# Patient Record
Sex: Male | Born: 1945 | Race: White | Hispanic: No | Marital: Married | Smoking: Former smoker
Health system: Southern US, Community
[De-identification: ages and names within clinical notes are randomized; demographics above are authoritative.]

## PROBLEM LIST (undated history)

## (undated) DIAGNOSIS — E559 Vitamin D deficiency, unspecified: Secondary | ICD-10-CM

## (undated) DIAGNOSIS — I509 Heart failure, unspecified: Secondary | ICD-10-CM

## (undated) DIAGNOSIS — I472 Ventricular tachycardia: Secondary | ICD-10-CM

## (undated) DIAGNOSIS — I219 Acute myocardial infarction, unspecified: Secondary | ICD-10-CM

## (undated) DIAGNOSIS — Z95810 Presence of automatic (implantable) cardiac defibrillator: Secondary | ICD-10-CM

## (undated) DIAGNOSIS — N486 Induration penis plastica: Secondary | ICD-10-CM

## (undated) DIAGNOSIS — I2510 Atherosclerotic heart disease of native coronary artery without angina pectoris: Secondary | ICD-10-CM

## (undated) DIAGNOSIS — I255 Ischemic cardiomyopathy: Secondary | ICD-10-CM

## (undated) DIAGNOSIS — N3010 Interstitial cystitis (chronic) without hematuria: Secondary | ICD-10-CM

## (undated) DIAGNOSIS — N400 Enlarged prostate without lower urinary tract symptoms: Secondary | ICD-10-CM

## (undated) DIAGNOSIS — F419 Anxiety disorder, unspecified: Secondary | ICD-10-CM

## (undated) DIAGNOSIS — Z1509 Genetic susceptibility to other malignant neoplasm: Secondary | ICD-10-CM

## (undated) DIAGNOSIS — F329 Major depressive disorder, single episode, unspecified: Secondary | ICD-10-CM

## (undated) DIAGNOSIS — I4729 Other ventricular tachycardia: Secondary | ICD-10-CM

## (undated) DIAGNOSIS — F32A Depression, unspecified: Secondary | ICD-10-CM

## (undated) DIAGNOSIS — Z8719 Personal history of other diseases of the digestive system: Secondary | ICD-10-CM

## (undated) DIAGNOSIS — L989 Disorder of the skin and subcutaneous tissue, unspecified: Secondary | ICD-10-CM

## (undated) DIAGNOSIS — Z85038 Personal history of other malignant neoplasm of large intestine: Secondary | ICD-10-CM

## (undated) HISTORY — PX: COLONOSCOPY: SHX174

## (undated) HISTORY — DX: Disorder of the skin and subcutaneous tissue, unspecified: L98.9

## (undated) HISTORY — DX: Atherosclerotic heart disease of native coronary artery without angina pectoris: I25.10

## (undated) HISTORY — DX: Ischemic cardiomyopathy: I25.5

## (undated) HISTORY — DX: Presence of automatic (implantable) cardiac defibrillator: Z95.810

## (undated) HISTORY — DX: Ventricular tachycardia: I47.2

## (undated) HISTORY — DX: Personal history of other malignant neoplasm of large intestine: Z85.038

## (undated) HISTORY — DX: Anxiety disorder, unspecified: F41.9

## (undated) HISTORY — DX: Other ventricular tachycardia: I47.29

## (undated) HISTORY — DX: Benign prostatic hyperplasia without lower urinary tract symptoms: N40.0

## (undated) HISTORY — DX: Vitamin D deficiency, unspecified: E55.9

## (undated) HISTORY — DX: Personal history of other diseases of the digestive system: Z87.19

## (undated) HISTORY — DX: Interstitial cystitis (chronic) without hematuria: N30.10

## (undated) HISTORY — DX: Depression, unspecified: F32.A

## (undated) HISTORY — DX: Induration penis plastica: N48.6

## (undated) HISTORY — PX: CARDIAC DEFIBRILLATOR PLACEMENT: SHX171

## (undated) HISTORY — DX: Major depressive disorder, single episode, unspecified: F32.9

## (undated) HISTORY — DX: Genetic susceptibility to other malignant neoplasm: Z15.09

---

## 1970-04-09 HISTORY — PX: TONSILLECTOMY: SHX5217

## 2002-12-17 ENCOUNTER — Encounter (HOSPITAL_COMMUNITY): Admission: RE | Admit: 2002-12-17 | Discharge: 2003-01-16 | Payer: Self-pay | Admitting: Pediatrics

## 2003-07-05 ENCOUNTER — Ambulatory Visit (HOSPITAL_COMMUNITY): Admission: RE | Admit: 2003-07-05 | Discharge: 2003-07-05 | Payer: Self-pay | Admitting: Family Medicine

## 2003-11-08 ENCOUNTER — Ambulatory Visit (HOSPITAL_COMMUNITY): Admission: RE | Admit: 2003-11-08 | Discharge: 2003-11-08 | Payer: Self-pay | Admitting: Family Medicine

## 2003-11-11 ENCOUNTER — Inpatient Hospital Stay (HOSPITAL_BASED_OUTPATIENT_CLINIC_OR_DEPARTMENT_OTHER): Admission: RE | Admit: 2003-11-11 | Discharge: 2003-11-11 | Payer: Self-pay | Admitting: *Deleted

## 2003-11-12 ENCOUNTER — Ambulatory Visit (HOSPITAL_COMMUNITY): Admission: RE | Admit: 2003-11-12 | Discharge: 2003-11-13 | Payer: Self-pay | Admitting: Cardiology

## 2004-01-13 ENCOUNTER — Ambulatory Visit (HOSPITAL_COMMUNITY): Admission: RE | Admit: 2004-01-13 | Discharge: 2004-01-14 | Payer: Self-pay | Admitting: *Deleted

## 2004-02-18 ENCOUNTER — Ambulatory Visit: Payer: Self-pay | Admitting: *Deleted

## 2004-04-09 DIAGNOSIS — I219 Acute myocardial infarction, unspecified: Secondary | ICD-10-CM

## 2004-04-09 HISTORY — PX: CORONARY STENT PLACEMENT: SHX1402

## 2004-04-09 HISTORY — DX: Acute myocardial infarction, unspecified: I21.9

## 2004-07-03 ENCOUNTER — Encounter (INDEPENDENT_AMBULATORY_CARE_PROVIDER_SITE_OTHER): Payer: Self-pay | Admitting: Specialist

## 2004-07-03 ENCOUNTER — Ambulatory Visit (HOSPITAL_COMMUNITY): Admission: RE | Admit: 2004-07-03 | Discharge: 2004-07-03 | Payer: Self-pay | Admitting: Gastroenterology

## 2004-07-07 ENCOUNTER — Encounter: Admission: RE | Admit: 2004-07-07 | Discharge: 2004-07-07 | Payer: Self-pay | Admitting: Surgery

## 2004-07-08 DIAGNOSIS — Z85038 Personal history of other malignant neoplasm of large intestine: Secondary | ICD-10-CM

## 2004-07-08 HISTORY — DX: Personal history of other malignant neoplasm of large intestine: Z85.038

## 2004-07-10 ENCOUNTER — Ambulatory Visit: Payer: Self-pay | Admitting: *Deleted

## 2004-07-18 ENCOUNTER — Inpatient Hospital Stay (HOSPITAL_BASED_OUTPATIENT_CLINIC_OR_DEPARTMENT_OTHER): Admission: RE | Admit: 2004-07-18 | Discharge: 2004-07-18 | Payer: Self-pay | Admitting: Cardiology

## 2004-07-18 ENCOUNTER — Ambulatory Visit: Payer: Self-pay | Admitting: Cardiology

## 2004-07-27 ENCOUNTER — Encounter (INDEPENDENT_AMBULATORY_CARE_PROVIDER_SITE_OTHER): Payer: Self-pay | Admitting: *Deleted

## 2004-07-27 ENCOUNTER — Inpatient Hospital Stay (HOSPITAL_COMMUNITY): Admission: RE | Admit: 2004-07-27 | Discharge: 2004-08-02 | Payer: Self-pay | Admitting: Surgery

## 2004-08-03 ENCOUNTER — Ambulatory Visit: Payer: Self-pay | Admitting: Oncology

## 2004-08-28 ENCOUNTER — Ambulatory Visit: Payer: Self-pay | Admitting: *Deleted

## 2004-11-30 ENCOUNTER — Ambulatory Visit: Payer: Self-pay | Admitting: Oncology

## 2005-03-19 ENCOUNTER — Ambulatory Visit (HOSPITAL_COMMUNITY): Admission: RE | Admit: 2005-03-19 | Discharge: 2005-03-19 | Payer: Self-pay | Admitting: Pediatrics

## 2005-04-09 HISTORY — PX: ABDOMINAL SURGERY: SHX537

## 2005-07-12 ENCOUNTER — Ambulatory Visit: Payer: Self-pay | Admitting: *Deleted

## 2005-07-16 ENCOUNTER — Ambulatory Visit: Payer: Self-pay | Admitting: Cardiology

## 2005-07-16 ENCOUNTER — Encounter (HOSPITAL_COMMUNITY): Admission: RE | Admit: 2005-07-16 | Discharge: 2005-08-15 | Payer: Self-pay | Admitting: *Deleted

## 2005-07-17 ENCOUNTER — Ambulatory Visit: Payer: Self-pay | Admitting: *Deleted

## 2005-07-18 ENCOUNTER — Inpatient Hospital Stay (HOSPITAL_BASED_OUTPATIENT_CLINIC_OR_DEPARTMENT_OTHER): Admission: RE | Admit: 2005-07-18 | Discharge: 2005-07-18 | Payer: Self-pay | Admitting: Cardiology

## 2005-07-18 ENCOUNTER — Ambulatory Visit: Payer: Self-pay | Admitting: Cardiology

## 2005-07-20 ENCOUNTER — Ambulatory Visit (HOSPITAL_COMMUNITY): Admission: RE | Admit: 2005-07-20 | Discharge: 2005-07-20 | Payer: Self-pay | Admitting: *Deleted

## 2005-07-20 ENCOUNTER — Ambulatory Visit: Payer: Self-pay | Admitting: Cardiology

## 2005-07-25 ENCOUNTER — Ambulatory Visit: Payer: Self-pay | Admitting: *Deleted

## 2005-08-02 ENCOUNTER — Ambulatory Visit: Payer: Self-pay | Admitting: Internal Medicine

## 2005-08-07 ENCOUNTER — Inpatient Hospital Stay (HOSPITAL_COMMUNITY): Admission: RE | Admit: 2005-08-07 | Discharge: 2005-08-08 | Payer: Self-pay | Admitting: Internal Medicine

## 2005-08-07 ENCOUNTER — Ambulatory Visit: Payer: Self-pay | Admitting: Internal Medicine

## 2005-08-07 HISTORY — PX: CARDIAC DEFIBRILLATOR PLACEMENT: SHX171

## 2005-08-22 ENCOUNTER — Ambulatory Visit: Payer: Self-pay

## 2005-09-07 ENCOUNTER — Ambulatory Visit (HOSPITAL_COMMUNITY): Admission: RE | Admit: 2005-09-07 | Discharge: 2005-09-07 | Payer: Self-pay | Admitting: Gastroenterology

## 2005-11-27 ENCOUNTER — Ambulatory Visit: Payer: Self-pay | Admitting: Internal Medicine

## 2005-11-30 ENCOUNTER — Ambulatory Visit: Payer: Self-pay | Admitting: Oncology

## 2005-11-30 LAB — COMPREHENSIVE METABOLIC PANEL
ALT: 23 U/L (ref 0–40)
AST: 19 U/L (ref 0–37)
Albumin: 4.4 g/dL (ref 3.5–5.2)
BUN: 20 mg/dL (ref 6–23)
Calcium: 9.5 mg/dL (ref 8.4–10.5)
Chloride: 104 mEq/L (ref 96–112)
Potassium: 4.6 mEq/L (ref 3.5–5.3)
Total Protein: 7.2 g/dL (ref 6.0–8.3)

## 2005-11-30 LAB — CBC WITH DIFFERENTIAL/PLATELET
Basophils Absolute: 0 10*3/uL (ref 0.0–0.1)
EOS%: 3.4 % (ref 0.0–7.0)
HGB: 13.1 g/dL (ref 13.0–17.1)
MCH: 27.4 pg — ABNORMAL LOW (ref 28.0–33.4)
NEUT#: 2.2 10*3/uL (ref 1.5–6.5)
RDW: 15.4 % — ABNORMAL HIGH (ref 11.2–14.6)
lymph#: 1.6 10*3/uL (ref 0.9–3.3)

## 2006-01-11 ENCOUNTER — Ambulatory Visit: Payer: Self-pay | Admitting: Cardiology

## 2006-02-18 ENCOUNTER — Ambulatory Visit: Payer: Self-pay | Admitting: Internal Medicine

## 2006-03-05 ENCOUNTER — Ambulatory Visit: Payer: Self-pay | Admitting: Oncology

## 2006-03-08 LAB — CBC WITH DIFFERENTIAL/PLATELET
Basophils Absolute: 0 10*3/uL (ref 0.0–0.1)
Eosinophils Absolute: 0.1 10*3/uL (ref 0.0–0.5)
HGB: 13.5 g/dL (ref 13.0–17.1)
MCV: 81.7 fL (ref 81.6–98.0)
MONO#: 0.5 10*3/uL (ref 0.1–0.9)
MONO%: 9.5 % (ref 0.0–13.0)
NEUT#: 2.6 10*3/uL (ref 1.5–6.5)
RBC: 4.88 10*6/uL (ref 4.20–5.71)
RDW: 14.6 % (ref 11.2–14.6)
WBC: 4.8 10*3/uL (ref 4.0–10.0)
lymph#: 1.6 10*3/uL (ref 0.9–3.3)

## 2006-03-08 LAB — CEA: CEA: <0.5 ng/mL (ref 0.0–5.0)

## 2006-03-28 ENCOUNTER — Ambulatory Visit: Payer: Self-pay | Admitting: Internal Medicine

## 2006-04-26 ENCOUNTER — Ambulatory Visit: Payer: Self-pay | Admitting: Internal Medicine

## 2006-05-03 ENCOUNTER — Encounter: Admission: RE | Admit: 2006-05-03 | Discharge: 2006-08-01 | Payer: Self-pay | Admitting: Internal Medicine

## 2006-05-30 ENCOUNTER — Ambulatory Visit: Payer: Self-pay | Admitting: Internal Medicine

## 2006-06-05 ENCOUNTER — Ambulatory Visit: Payer: Self-pay | Admitting: Oncology

## 2006-06-10 ENCOUNTER — Other Ambulatory Visit: Admission: RE | Admit: 2006-06-10 | Discharge: 2006-06-10 | Payer: Self-pay | Admitting: Oncology

## 2006-06-10 ENCOUNTER — Encounter (INDEPENDENT_AMBULATORY_CARE_PROVIDER_SITE_OTHER): Payer: Self-pay | Admitting: Specialist

## 2006-06-10 LAB — COMPREHENSIVE METABOLIC PANEL
Alkaline Phosphatase: 47 U/L (ref 39–117)
BUN: 25 mg/dL — ABNORMAL HIGH (ref 6–23)
Creatinine, Ser: 1.16 mg/dL (ref 0.40–1.50)
Glucose, Bld: 91 mg/dL (ref 70–99)
Total Bilirubin: 0.7 mg/dL (ref 0.3–1.2)

## 2006-06-10 LAB — CEA: CEA: <0.5 ng/mL (ref 0.0–5.0)

## 2006-06-17 LAB — CYTOLOGY, URINE

## 2006-07-04 ENCOUNTER — Ambulatory Visit: Payer: Self-pay | Admitting: Internal Medicine

## 2006-08-30 ENCOUNTER — Ambulatory Visit: Payer: Self-pay | Admitting: Internal Medicine

## 2006-09-09 ENCOUNTER — Ambulatory Visit (HOSPITAL_COMMUNITY): Admission: RE | Admit: 2006-09-09 | Discharge: 2006-09-09 | Payer: Self-pay | Admitting: Oncology

## 2006-09-17 ENCOUNTER — Ambulatory Visit (HOSPITAL_COMMUNITY): Admission: RE | Admit: 2006-09-17 | Discharge: 2006-09-17 | Payer: Self-pay | Admitting: Gastroenterology

## 2006-09-28 ENCOUNTER — Ambulatory Visit: Payer: Self-pay | Admitting: Internal Medicine

## 2006-10-03 ENCOUNTER — Ambulatory Visit: Payer: Self-pay | Admitting: Internal Medicine

## 2006-10-16 ENCOUNTER — Encounter (INDEPENDENT_AMBULATORY_CARE_PROVIDER_SITE_OTHER): Payer: Self-pay | Admitting: Urology

## 2006-10-16 ENCOUNTER — Ambulatory Visit (HOSPITAL_COMMUNITY): Admission: RE | Admit: 2006-10-16 | Discharge: 2006-10-16 | Payer: Self-pay | Admitting: Urology

## 2006-10-24 ENCOUNTER — Encounter: Payer: Self-pay | Admitting: Otolaryngology

## 2006-11-01 ENCOUNTER — Ambulatory Visit (HOSPITAL_COMMUNITY): Admission: RE | Admit: 2006-11-01 | Discharge: 2006-11-01 | Payer: Self-pay | Admitting: Otolaryngology

## 2006-11-01 ENCOUNTER — Encounter (INDEPENDENT_AMBULATORY_CARE_PROVIDER_SITE_OTHER): Payer: Self-pay | Admitting: Otolaryngology

## 2006-11-28 ENCOUNTER — Ambulatory Visit: Payer: Self-pay | Admitting: Internal Medicine

## 2006-12-05 ENCOUNTER — Ambulatory Visit: Payer: Self-pay | Admitting: Oncology

## 2006-12-10 LAB — CEA: CEA: <0.5 ng/mL (ref 0.0–5.0)

## 2006-12-13 ENCOUNTER — Ambulatory Visit: Payer: Self-pay | Admitting: Internal Medicine

## 2007-01-14 ENCOUNTER — Ambulatory Visit: Payer: Self-pay | Admitting: Internal Medicine

## 2007-02-24 ENCOUNTER — Ambulatory Visit (HOSPITAL_COMMUNITY): Admission: RE | Admit: 2007-02-24 | Discharge: 2007-02-24 | Payer: Self-pay | Admitting: Internal Medicine

## 2007-02-24 ENCOUNTER — Ambulatory Visit: Payer: Self-pay | Admitting: Internal Medicine

## 2007-03-25 ENCOUNTER — Observation Stay (HOSPITAL_COMMUNITY): Admission: AD | Admit: 2007-03-25 | Discharge: 2007-03-26 | Payer: Self-pay | Admitting: Cardiology

## 2007-03-25 ENCOUNTER — Emergency Department (HOSPITAL_COMMUNITY): Admission: EM | Admit: 2007-03-25 | Discharge: 2007-03-25 | Payer: Self-pay | Admitting: Emergency Medicine

## 2007-03-25 ENCOUNTER — Ambulatory Visit: Payer: Self-pay | Admitting: Cardiology

## 2007-04-17 ENCOUNTER — Ambulatory Visit: Payer: Self-pay | Admitting: Internal Medicine

## 2007-05-29 ENCOUNTER — Ambulatory Visit: Payer: Self-pay | Admitting: Internal Medicine

## 2007-06-05 ENCOUNTER — Ambulatory Visit: Payer: Self-pay | Admitting: Oncology

## 2007-07-17 ENCOUNTER — Ambulatory Visit: Payer: Self-pay | Admitting: Internal Medicine

## 2007-07-17 ENCOUNTER — Ambulatory Visit: Payer: Self-pay | Admitting: Oncology

## 2007-08-28 ENCOUNTER — Ambulatory Visit: Payer: Self-pay | Admitting: Internal Medicine

## 2007-11-27 ENCOUNTER — Ambulatory Visit: Payer: Self-pay | Admitting: Internal Medicine

## 2007-12-25 ENCOUNTER — Ambulatory Visit (HOSPITAL_COMMUNITY): Admission: RE | Admit: 2007-12-25 | Discharge: 2007-12-25 | Payer: Self-pay | Admitting: Pediatrics

## 2008-01-08 ENCOUNTER — Ambulatory Visit: Payer: Self-pay | Admitting: Internal Medicine

## 2008-02-26 ENCOUNTER — Ambulatory Visit: Payer: Self-pay | Admitting: Internal Medicine

## 2008-06-23 ENCOUNTER — Encounter: Payer: Self-pay | Admitting: Internal Medicine

## 2008-06-29 ENCOUNTER — Ambulatory Visit: Payer: Self-pay | Admitting: Internal Medicine

## 2008-06-29 ENCOUNTER — Encounter: Payer: Self-pay | Admitting: Internal Medicine

## 2008-07-02 ENCOUNTER — Encounter: Payer: Self-pay | Admitting: Internal Medicine

## 2008-07-02 ENCOUNTER — Ambulatory Visit: Payer: Self-pay | Admitting: Internal Medicine

## 2008-07-02 DIAGNOSIS — I509 Heart failure, unspecified: Secondary | ICD-10-CM

## 2008-07-02 DIAGNOSIS — I2510 Atherosclerotic heart disease of native coronary artery without angina pectoris: Secondary | ICD-10-CM

## 2008-07-20 ENCOUNTER — Telehealth: Payer: Self-pay | Admitting: Cardiology

## 2008-07-20 ENCOUNTER — Ambulatory Visit: Payer: Self-pay | Admitting: Oncology

## 2008-07-22 LAB — CEA: CEA: <0.5 ng/mL (ref 0.0–5.0)

## 2008-09-23 ENCOUNTER — Encounter (INDEPENDENT_AMBULATORY_CARE_PROVIDER_SITE_OTHER): Payer: Self-pay | Admitting: Gastroenterology

## 2008-09-23 ENCOUNTER — Ambulatory Visit (HOSPITAL_COMMUNITY): Admission: RE | Admit: 2008-09-23 | Discharge: 2008-09-23 | Payer: Self-pay | Admitting: Gastroenterology

## 2008-09-30 ENCOUNTER — Ambulatory Visit: Payer: Self-pay | Admitting: Internal Medicine

## 2008-12-21 ENCOUNTER — Encounter (INDEPENDENT_AMBULATORY_CARE_PROVIDER_SITE_OTHER): Payer: Self-pay | Admitting: *Deleted

## 2008-12-30 ENCOUNTER — Ambulatory Visit: Payer: Self-pay | Admitting: Internal Medicine

## 2009-02-07 ENCOUNTER — Encounter: Payer: Self-pay | Admitting: Internal Medicine

## 2009-03-11 ENCOUNTER — Ambulatory Visit: Payer: Self-pay | Admitting: Internal Medicine

## 2009-03-30 ENCOUNTER — Ambulatory Visit: Payer: Self-pay | Admitting: Internal Medicine

## 2009-03-31 ENCOUNTER — Encounter: Payer: Self-pay | Admitting: Internal Medicine

## 2009-04-12 ENCOUNTER — Encounter: Payer: Self-pay | Admitting: Internal Medicine

## 2009-05-31 ENCOUNTER — Encounter: Payer: Self-pay | Admitting: Internal Medicine

## 2009-06-01 LAB — CONVERTED CEMR LAB
CO2: 31 meq/L (ref 19–32)
Calcium: 9.4 mg/dL (ref 8.4–10.5)
Potassium: 4.6 meq/L (ref 3.5–5.3)
Sodium: 141 meq/L (ref 135–145)

## 2009-07-01 ENCOUNTER — Ambulatory Visit: Payer: Self-pay | Admitting: Internal Medicine

## 2009-07-20 ENCOUNTER — Ambulatory Visit: Payer: Self-pay | Admitting: Oncology

## 2009-07-22 LAB — CEA: CEA: 0.6 ng/mL (ref 0.0–5.0)

## 2009-08-08 ENCOUNTER — Ambulatory Visit: Payer: Self-pay | Admitting: Internal Medicine

## 2009-08-08 DIAGNOSIS — Z95810 Presence of automatic (implantable) cardiac defibrillator: Secondary | ICD-10-CM | POA: Insufficient documentation

## 2009-11-18 ENCOUNTER — Encounter (INDEPENDENT_AMBULATORY_CARE_PROVIDER_SITE_OTHER): Payer: Self-pay | Admitting: *Deleted

## 2009-11-21 ENCOUNTER — Ambulatory Visit: Payer: Self-pay | Admitting: Internal Medicine

## 2010-01-24 ENCOUNTER — Encounter: Payer: Self-pay | Admitting: Internal Medicine

## 2010-01-27 ENCOUNTER — Ambulatory Visit: Payer: Self-pay | Admitting: Internal Medicine

## 2010-01-27 ENCOUNTER — Encounter: Payer: Self-pay | Admitting: Internal Medicine

## 2010-03-02 ENCOUNTER — Ambulatory Visit: Payer: Self-pay | Admitting: Internal Medicine

## 2010-03-03 ENCOUNTER — Encounter: Payer: Self-pay | Admitting: Internal Medicine

## 2010-03-12 ENCOUNTER — Inpatient Hospital Stay (HOSPITAL_COMMUNITY)
Admission: EM | Admit: 2010-03-12 | Discharge: 2010-03-13 | Payer: Self-pay | Source: Home / Self Care | Attending: Internal Medicine | Admitting: Internal Medicine

## 2010-03-13 ENCOUNTER — Telehealth: Payer: Self-pay | Admitting: Internal Medicine

## 2010-03-17 ENCOUNTER — Encounter: Payer: Self-pay | Admitting: Internal Medicine

## 2010-03-17 ENCOUNTER — Ambulatory Visit: Payer: Self-pay | Admitting: Internal Medicine

## 2010-03-21 ENCOUNTER — Encounter (HOSPITAL_COMMUNITY)
Admission: RE | Admit: 2010-03-21 | Discharge: 2010-04-20 | Payer: Self-pay | Source: Home / Self Care | Attending: Pediatrics | Admitting: Pediatrics

## 2010-04-29 ENCOUNTER — Encounter: Payer: Self-pay | Admitting: Pediatrics

## 2010-04-30 ENCOUNTER — Encounter: Payer: Self-pay | Admitting: Pediatrics

## 2010-05-11 NOTE — Assessment & Plan Note (Signed)
Summary: eph 12/4 per phone call 12/5  jr [address] 845 am   Visit Type:  Post-hospital Primary Provider:  Acey Lav  CC:  No cardiac complaints.  History of Present Illness: IDENTIFICATION:  Jake Carter is a 65 year old gentleman with a history of CAD (status post MI with stent to the LAD in 2005), perfusion study in 2007 which showed an apical infarction, EF of 11.  He underwent repeat angiography that showed a 25% lesion before the stent and patent stent. 30% diagonal, otherwise, no significant disease.  RCA was non-dominant. EF was 25-30%  I last saw him in clinic in October.  He was just hospitalized a few days ago with chest pains.  He ruled out for MI and was sent home.  He described it as a epigastirc pain.  Lasted hours.  He walked on treadmill with pain without any change.  He went to ER because he did not know what was causing it. Since D/C he has not had any furhter spells.  He has been seen by S. Halm.  There appears to be some relation to food.intake.  He is being set up for a HIDA scan.  He has not had any furhter spells.  Doing his usual activities.  Allergies: 1)  ! Phenergan  Past History:  Past medical, surgical, family and social histories (including risk factors) reviewed, and no changes noted (except as noted below).  Past Medical History: Reviewed history from 03/11/2009 and no changes required. 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 Guidant ICD placement -- 5/07  Family History: Reviewed history from 06/15/2008 and no changes required. Mother died of cancer at 110.  Father died of cancer 51.   Sister died of cancer 15, and a brother died of cancer at 76.   Social History: Reviewed history from 06/15/2008 and no changes required. Full Time Married  Tobacco Use -  Former.  -- 1981 Alcohol Use - yes Regular Exercise - yes  Review of Systems       Stopped lisinopril because of dizziness.  Vital Signs:  Patient profile:   65 year old male Height:      67 inches Weight:      171.75 pounds BMI:     27.00 Pulse rate:   68 / minute Pulse rhythm:   regular Resp:     18 per minute BP sitting:   114 / 70  (left arm) Cuff size:   large  Vitals Entered By: Vikki Ports (March 17, 2010 8:36 AM)  Physical Exam  Additional Exam:  patient is in NAD HEENT:  Normocephalic, atraumatic. EOMI, PERRLA.  Neck: JVP is normal. No thyromegaly. No bruits.  Lungs: clear to auscultation. No rales no wheezes.  Heart: Regular rate and rhythm. Normal S1, S2. No S3.   No significant murmurs. PMI not displaced.  Abdomen:  Supple, nontender. Normal bowel sounds. No masses. No hepatomegaly.  Extremities:   Good distal pulses throughout. No lower extremity edema.  Musculoskeletal :moving all extremities.  Neuro:   alert and oriented x3.     ICD Specifications Following MD:  [name], MD     ICD Vendor:  [facility] [employer]     ICD Model Number:  T175     ICD Serial Number:  [id number] ICD DOI:  08/07/2005      Lead 1:    Location:  RV     DOI: 08/07/2005     Model #: [id number]     Serial #: [id number]     Status: active  Indications::  NSVT; ICM  Explantation Comments: Latitude  ICD Follow Up ICD Dependent:  No      Brady Parameters Mode VVI     Lower Rate Limit:  40      Tachy Zones VF:  240     VT:  200     VT1:  170     Impression & Recommendations:  Problem # 1:  CAD, NATIVE VESSEL (ICD-414.01) I am not convinced that current spells represent coronary ischemia.  I agree that a GI eval is warranted.   HIDA scan is scheduled.  If neg he will be set up to be seen by R. Buccini. I would keep on same meds.  Would not push BP further. The following medications were removed from the medication list:    Prinivil 5 Mg Tabs (Lisinopril) .Marland Kitchen... Take one tablet by mouth  daily His updated medication list for this problem includes:    Plavix 75 Mg Tabs (Clopidogrel bisulfate) .Marland Kitchen... Take one tablet by mouth daily    Metoprolol Tartrate 25 Mg Tabs (Metoprolol tartrate) .Marland Kitchen... Take 1/2 tablet once daily    Aspirin 81 Mg Tbec (Aspirin) .Marland Kitchen... 2 by mouth daily    Nitrostat 0.4 Mg Subl (Nitroglycerin) .Marland Kitchen... Take one tablet onde the tounge as needed for chest pain. if pain continues, you  may repeat every 5 minutes for a total of 3 pills  Orders: EKG w/ Interpretation (93000)  Problem # 2:  CONGESTIVE HEART FAILURE, UNSPECIFIED (ICD-428.0) Volume status looks good.   The following medications were removed from the medication list:    Prinivil 5 Mg Tabs (Lisinopril) .Marland Kitchen... Take one tablet by mouth daily His updated medication list for this problem includes:    Plavix 75 Mg Tabs (Clopidogrel bisulfate) .Marland Kitchen... Take one tablet by mouth daily    Metoprolol Tartrate 25 Mg Tabs (Metoprolol tartrate) .Marland Kitchen... Take 1/2 tablet once daily    Aspirin 81 Mg Tbec (Aspirin) .Marland Kitchen... 2 by mouth daily    Nitrostat 0.4 Mg Subl (Nitroglycerin) .Marland Kitchen... Take one tablet onde the tounge as needed for chest pain. if pain continues, you  may repeat every 5 minutes for a total of 3 pills  Problem # 3:  HYPERLIPIDEMIA-MIXED (ICD-272.4) Keep on same regimen. His updated medication list for this problem includes:    Lipitor 20 Mg Tabs (Atorvastatin calcium) .Marland Kitchen... Take one tablet by mouth daily.  Appended Document: eph 12/4 per phone call 12/5  jr apt. 845 am EKG:  Sinus rhythm.  Anterolateral MI.

## 2010-05-11 NOTE — Letter (Signed)
Summary: Remote Device Check  [employer], Main Office  [address]   [place], [place] [address]   Phone: [phone]  Fax: [phone]     April 12, 2009 MRN: [id number]   [facility] [address] [place], [place]  [address]   Dear Mr. Veale,   Your remote transmission was recieved and reviewed by your physician.  All diagnostics were within normal limits for you.   ___X___Your next office visit is scheduled for:     March 2011 with Dr Ladona Ridgel. Please call our office to schedule an appointment.    Sincerely,  Proofreader

## 2010-05-11 NOTE — Letter (Signed)
Summary: Device-Delinquent Phone Journalist, newspaper, Main Office  [address]   [place], [place] [address]   Phone: [phone]  Fax: [phone]     November 18, 2009 MRN: [id number]   [facility] [address] [place], [place]  [address]   Dear Mr. Helming,  According to our records, you were scheduled for a device phone transmission on   11/17/2009.                           .     We did not receive any results from this check.  If you transmitted on your scheduled day, please call us to help troubleshoot your system.  If you forgot to send your transmission, please send one upon receipt of this letter.  Thank you, Altha Harm, LPN  November 18, 2009 10:56 AM   [facility]

## 2010-05-11 NOTE — Assessment & Plan Note (Signed)
Summary: 3 month rov/sl   Visit Type:  Follow-up Primary Provider:  Acey Lav  CC:  no cardaic complaints. Pt is holding his lisinopril due to a cough..  History of Present Illness: Patient is a 65 year old with a history of CAD, cardiomyopathy, dyslipidemia.  I last saw him in the fall. He called in Feb.  Not feeling well.  Lab work was negative.   Since then he is feeling better. He denies chest pain.  No dizziness.  He is working out regularly. He has had a cough for months.  He thinks it may be due to a new dog they have .  He does not think it got worse with lisinopril but he has stopped it.  Still has a cough, mainly at night.  He says he feels drainage in his throat.  Current Medications (verified): 1)  Plavix 75 Mg Tabs (Clopidogrel Bisulfate) .... Take One Tablet By Mouth Daily 2)  Lipitor 20 Mg Tabs (Atorvastatin Calcium) .... Take One Tablet By Mouth Daily. 3)  Effexor Xr 37.5 Mg Xr24h-Cap (Venlafaxine Hcl) .Marland Kitchen.. 1 By Mouth Daily 4)  Metoprolol Tartrate 25 Mg Tabs (Metoprolol Tartrate) .... Take 1/2 Tablet Once Daily 5)  Aspirin 81 Mg Tbec (Aspirin) .... 2 By Mouth Daily 6)  Xanax 0.25 Mg Tabs (Alprazolam) .... As Needed  Allergies (verified): 1)  ! Phenergan  Past History:  Past medical, surgical, family and social histories (including risk factors) reviewed, and no changes noted (except as noted below).  Past Medical History: Reviewed history from 03/11/2009 and no changes required. 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 Guidant ICD placement -- 5/07  Family History: Reviewed history from 06/15/2008 and no changes required. Mother died of cancer at 19.  Father died of cancer 45.   Sister died of cancer 56, and a brother died of cancer at 25.   Social History: Reviewed  history from 06/15/2008 and no changes required. Full Time Married  Tobacco Use - Former.  -- 1981 Alcohol Use - yes Regular Exercise - yes  Vital Signs:  Patient profile:   64 year old male Height:      67 inches Weight:      163 pounds BMI:     25.62 Pulse rate:   60 / minute BP sitting:   110 / 68  (left arm) Cuff size:   regular  Vitals Entered By: Burnett Kanaris, CNA (July 01, 2009 10:36 AM)  Physical Exam  Additional Exam:  Patient is in NAD at rest HEENT:  Normocephalic, atraumatic. EOMI, PERRLA.  Neck: JVP is normal. No thyromegaly. No bruits.  Lungs: clear to auscultation. No rales no wheezes.  Heart: Regular rate and rhythm. Normal S1, S2. No S3.   No significant murmurs. PMI not displaced.  Abdomen:  Supple, nontender. Normal bowel sounds. No masses. No hepatomegaly.  Extremities:   Good distal pulses throughout. No lower extremity edema.  Musculoskeletal :moving all extremities.  Neuro:   alert and oriented x3.     ICD Specifications ICD Vendor:  [employer]     ICD Model Number:  T175     ICD Serial Number:  [id number] ICD DOI:  08/07/2005      Lead 1:    Location: RV     DOI: 08/07/2005     Model #: [id number]     Serial #:  [id number]     Status: active  Indications::  NSVT; ICM  Explantation Comments: Latitude  ICD Follow Up ICD Dependent:  No      Brady Parameters Mode VVI     Lower Rate Limit:  40      Tachy Zones VF:  240     VT:  200     VT1:  170     Impression & Recommendations:  Problem # 1:  CAD, NATIVE VESSEL (ICD-414.01) Asymptmatic.  Continue meds  Problem # 2:  CONGESTIVE HEART FAILURE, UNSPECIFIED (ICD-428.0) Volume status looks good.  Has f/u for ICD soon.  Keep on current regimen. I am not convinced it is the ACE inhibitor.  I sugg a trial of antihistamine.  I also sugg the dog not sleep in the bedroom He will try to resume lisinopril and see how his symptoms change.  Problem # 3:  HYPERLIPIDEMIA-MIXED (ICD-272.4) Keep on statin.   Excellent control. His updated medication list for this problem includes:    Lipitor 20 Mg Tabs (Atorvastatin calcium) .Marland Kitchen... Take one tablet by mouth daily. Prescriptions: PROTONIX 40 MG TBEC (PANTOPRAZOLE SODIUM) 1 pill every day  #30 x 6   Entered by:   Layne Benton, RN, BSN   Authorized by:   Sherrill Raring, MD, [facility]   Signed by:   Layne Benton, RN, BSN on 07/01/2009   Method used:   Electronically to        [facility]* (retail)       [address]       [place], [place]  [address]       Ph: [phone] or [phone]       Fax: [phone]   RxID:   [id number]

## 2010-05-11 NOTE — Assessment & Plan Note (Signed)
Summary: pc2 guidant/sl/appt time is 11:00/saf   Visit Type:  Follow-up Primary Provider:  Acey Lav   History of Present Illness: Mr. Advani returns today for followup.  He is a pleasant 65 yo man with an ICM, CHF (class 1-2) and dyslipidemia.  He denies c/p or sob.  His cough has improved after starting zyrtec.  He has had no intercurrent ICD therapies.  Current Medications (verified): 1)  Plavix 75 Mg Tabs (Clopidogrel Bisulfate) .... Take One Tablet By Mouth Daily 2)  Lipitor 20 Mg Tabs (Atorvastatin Calcium) .... Take One Tablet By Mouth Daily. 3)  Effexor Xr 37.5 Mg Xr24h-Cap (Venlafaxine Hcl) .... 3 Times Per Week. 4)  Metoprolol Tartrate 25 Mg Tabs (Metoprolol Tartrate) .... Take 1/2 Tablet Once Daily 5)  Aspirin 81 Mg Tbec (Aspirin) .... 2 By Mouth Daily 6)  Xanax 0.25 Mg Tabs (Alprazolam) .... As Needed 7)  Protonix 40 Mg Tbec (Pantoprazole Sodium) .... As Needed 8)  Lisinopril 2.5 Mg Tabs (Lisinopril) .... Take One-Half Tablet By Mouth Daily  Allergies (verified): 1)  ! 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    Vital Signs:  Patient profile:   65 year old male Height:      67 inches Weight:      162 pounds BMI:     25.46 Pulse rate:   62 / minute BP sitting:   92 / 60  (left arm)  Vitals Entered By: Laurance Flatten CMA (Aug 08, 2009 11:06 AM)  Physical Exam  General:  Well developed, well nourished, in no acute distress.  HEENT: normal Neck: supple. No JVD. Carotids 2+ bilaterally no bruits Cor: RRR no rubs, gallops or murmur. PMI is enlarged and laterally displaced. Lungs: CTA Ab: soft,  nontender. nondistended. No HSM. Good bowel sounds Ext: warm. no cyanosis, clubbing or edema Neuro: alert and oriented. Grossly nonfocal. affect pleasant     ICD Specifications Following MD:  [name], MD     ICD Vendor:  [facility] [employer]     ICD Model Number:  T175     ICD Serial Number:  [id number] ICD DOI:  08/07/2005      Lead 1:    Location: RV     DOI: 08/07/2005     Model #: [id number]     Serial #: [id number]     Status: active  Indications::  NSVT; ICM  Explantation Comments: Latitude  ICD Follow Up Remote Check?  No Battery Voltage:  2.66 V     Charge Time:  11.5 seconds     Underlying rhythm:  SR ICD Dependent:  No       ICD Device Measurements Right Ventricle:  Amplitude: 11.4 mV, Impedance: 655 ohms, Threshold: 0.8 V at 0.4 msec Shock Impedance: 49 ohms   Episodes MS Episodes:  0     Percent Mode Switch:  0     Shock:  0     ATP:  0     Nonsustained:  0     Atrial Therapies:  0 Ventricular Pacing:  0  Brady Parameters Mode VVI     Lower Rate Limit:  40      Tachy Zones VF:  240  VT:  200     VT1:  170     Tech Comments:  NORMAL DEVICE FUNCTION.  NO EPISODES AND NO CHANGES MADE.  Vella Kohler MD Comments:  Agree with above.  Impression & Recommendations:  Problem # 1:  AUTOMATIC IMPLANTABLE CARDIAC DEFIBRILLATOR SITU (ICD-V45.02) Normal device function.  Will recheck in several months.  Problem # 2:  CAD, NATIVE VESSEL (ICD-414.01) He denies anginal symptoms.  Continue current meds. His updated medication list for this problem includes:    Plavix 75 Mg Tabs (Clopidogrel bisulfate) .Marland Kitchen... Take one tablet by mouth daily    Metoprolol Tartrate 25 Mg Tabs (Metoprolol tartrate) .Marland Kitchen... Take 1/2 tablet once daily    Aspirin 81 Mg Tbec (Aspirin) .Marland Kitchen... 2 by mouth daily    Lisinopril 2.5 Mg Tabs (Lisinopril) .Marland Kitchen... Take one-half tablet by mouth daily  Problem # 3:  CONGESTIVE HEART FAILURE, UNSPECIFIED (ICD-428.0) He remains class 1-2.  Continue meds as below. His  updated medication list for this problem includes:    Plavix 75 Mg Tabs (Clopidogrel bisulfate) .Marland Kitchen... Take one tablet by mouth daily    Metoprolol Tartrate 25 Mg Tabs (Metoprolol tartrate) .Marland Kitchen... Take 1/2 tablet once daily    Aspirin 81 Mg Tbec (Aspirin) .Marland Kitchen... 2 by mouth daily    Lisinopril 2.5 Mg Tabs (Lisinopril) .Marland Kitchen... Take one-half tablet by mouth daily

## 2010-05-11 NOTE — Assessment & Plan Note (Signed)
Summary: per check out/sf   Visit Type:  Follow-up Primary Provider:  Acey Lav  CC:  no cardaic complaints.  History of Present Illness: patient is a 65 year old with a history of CAD, ICM.  I last saw him in March.  He has been seen in the interval by G. Ladona Ridgel. since seen he has done well  He remains active.  NO chest pains.  No SOB with activity.  He still has nasal D/C (clear) with postnasal drip    If takes Zyrtec it is better.  Dog is out of bedroom. Also had one spell of severe R foot pain  Felt possibly to be due to Gout.   Followed by S. Halm   Current Medications (verified): 1)  Plavix 75 Mg Tabs (Clopidogrel Bisulfate) .... Take One Tablet By Mouth Daily 2)  Lipitor 20 Mg Tabs (Atorvastatin Calcium) .... Take One Tablet By Mouth Daily. 3)  Effexor Xr 37.5 Mg Xr24h-Cap (Venlafaxine Hcl) .... 3 Times Per Week. 4)  Metoprolol Tartrate 25 Mg Tabs (Metoprolol Tartrate) .... Take 1/2 Tablet Once Daily 5)  Aspirin 81 Mg Tbec (Aspirin) .... 2 By Mouth Daily 6)  Xanax 0.25 Mg Tabs (Alprazolam) .... As Needed 7)  Famotidine 20 Mg Tabs (Famotidine) .... Daily 8)  Lisinopril 2.5 Mg Tabs (Lisinopril) .... Take One-Half Tablet By Mouth Daily  Allergies (verified): 1)  ! Phenergan  Past History:  Past medical, surgical, family and social histories (including risk factors) reviewed, and no changes noted (except as noted below).  Past Medical History: Reviewed history from 03/11/2009 and no changes required. 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 Guidant ICD placement -- 5/07  Family History: Reviewed history from 06/15/2008 and no changes required. Mother died of cancer at 51.  Father died of cancer 46.   Sister died of cancer 70, and a brother died of cancer at 24.   Social  History: Reviewed history from 06/15/2008 and no changes required. Full Time Married  Tobacco Use - Former.  -- 1981 Alcohol Use - yes Regular Exercise - yes  Review of Systems       All systems reviewed.  Neg to the above problem except as noted above.  Vital Signs:  Patient profile:   65 year old male Height:      67 inches Weight:      169 pounds BMI:     26.56 Pulse rate:   65 / minute BP sitting:   115 / 64  (left arm) Cuff size:   regular  Vitals Entered By: Burnett Kanaris, CNA (January 27, 2010 10:39 AM)  Physical Exam  Additional Exam:  patient is in NAD  Sniffling HEENT:  Normocephalic, atraumatic. EOMI, PERRLA.  Neck: JVP is normal. No thyromegaly. No bruits.  Lungs: clear to auscultation. No rales no wheezes.  Heart: Regular rate and rhythm. Normal S1, S2. No S3.   No significant murmurs. PMI not displaced.  Abdomen:  Supple, nontender. Normal bowel sounds. No masses. No hepatomegaly.  Extremities:   Good distal pulses throughout. No lower extremity edema.  Musculoskeletal :moving all extremities.  Neuro:   alert and oriented x3.     ICD Specifications Following MD:  [name], MD     ICD Vendor:  [facility] [employer]     ICD Model Number:  210 320 1870     ICD Serial Number:  [id number] ICD DOI:  08/07/2005      Lead 1:    Location: RV     DOI: 08/07/2005     Model #: [id number]     Serial #: [id number]     Status: active  Indications::  NSVT; ICM  Explantation Comments: Latitude  ICD Follow Up ICD Dependent:  No      Brady Parameters Mode VVI     Lower Rate Limit:  40      Tachy Zones VF:  240     VT:  200     VT1:  170     Impression & Recommendations:  Problem # 1:  CAD, NATIVE VESSEL (ICD-414.01) No signs of active ischemia.  Continue meds.  Problem # 2:  HYPERLIPIDEMIA-MIXED (ICD-272.4) Excellent on last check.  Continue.  Problem # 3:  CONGESTIVE HEART FAILURE, UNSPECIFIED (ICD-428.0) Volume status is good.  Functional Class 1 to 2.   With BP running a  little higher will increase Lisinopril to 5.  I do not htink this is contributing to his nasal symptoms.  Stopped in past  No change. Continue metoprolol at current dose He is very sensitive to meds.  He has had signif dizziness in the past.  i am not eager to push more.  would not add other agents given titration problems in past. Stay active.  F/U with Rosette Reveal.  Other Orders: EKG w/ Interpretation (93000)  Patient Instructions: 1)  Your physician recommends that you schedule a follow-up appointment in: 6 months. The office will mail you a reminder letter 2 months prior appointment date. 2)  Your physician has recommended you make the following change in your medication: Lisinopril dose increased to 5 mg once daily. New medication NTG 0.4 mg as needed for chest pain. Prescriptions: NITROSTAT 0.4 MG SUBL (NITROGLYCERIN) take one tablet onde the tounge as needed for chest pain. If pain continues, you  may repeat every 5 minutes for a total of 3 pills  #24 x 3   Entered by:   Ollen Gross, RN, BSN   Authorized by:   Sherrill Raring, MD, [facility]   Signed by:   Ollen Gross, RN, BSN on 01/27/2010   Method used:   Electronically to        [facility] (retail)       [address]       [place], [place]  [address]       Ph: [phone] or [phone]       Fax: [phone]   RxID:   [id number] PRINIVIL 5 MG TABS (LISINOPRIL) Take one tablet by mouth daily  #30 x 6   Entered by:   Ollen Gross, RN, BSN   Authorized by:   Sherrill Raring, MD, [facility]   Signed by:   Ollen Gross, RN, BSN on 01/27/2010   Method used:   Electronically to        [facility] (retail)       [address]       [place], [place]  [address]       Ph: [phone] or [phone]       Fax: [phone]   RxID:   [id number]

## 2010-05-11 NOTE — Cardiovascular Report (Signed)
Summary: Office Visit Remote   Office Visit Remote   Imported By: Roderic Ovens 12/20/2009 10:00:42  _____________________________________________________________________  External Attachment:    Type:   Image     Comment:   External Document

## 2010-05-11 NOTE — Progress Notes (Signed)
Summary: re post hospital visit  Phone Note Call from Patient   Caller: Patient [phone] Reason for Call: Talk to Nurse Summary of Call: pt just released from the hospital said he was to be seen asap with dr Tenny Craw, checking in echart there is no indication of this, her next appt is 1-9, can he wait or double book? Initial call taken by: Glynda Jaeger,  March 13, 2010 4:04 PM  Follow-up for Phone Call        Dr.Ross called patient and she advised him to come to the office on 12/9 at 845 am to see her for a post hospital visit.   Layne Benton, RN, BSN  March 13, 2010 5:27 PM

## 2010-05-11 NOTE — Miscellaneous (Signed)
Summary: Orders Update  Per phone call from Dr. Tenny Craw to Raechel Ache. Clinical Lists Changes  Orders: Added new Test order of T-Basic Metabolic Panel [phone]) - Signed Added new Test order of T-BNP  (B Natriuretic Peptide) ([id number]) - Signed Added new Referral order of Cardionet/Event Monitor (Cardionet/Event) - Signed

## 2010-05-11 NOTE — Cardiovascular Report (Signed)
Summary: Office Visit Remote   Office Visit Remote   Imported By: Roderic Ovens 03/17/2010 15:08:54  _____________________________________________________________________  External Attachment:    Type:   Image     Comment:   External Document

## 2010-05-12 NOTE — Cardiovascular Report (Signed)
Summary: Office Visit   Office Visit   Imported By: Roderic Ovens 08/12/2009 14:31:40  _____________________________________________________________________  External Attachment:    Type:   Image     Comment:   External Document

## 2010-06-01 ENCOUNTER — Encounter (INDEPENDENT_AMBULATORY_CARE_PROVIDER_SITE_OTHER): Payer: PRIVATE HEALTH INSURANCE

## 2010-06-01 ENCOUNTER — Encounter: Payer: Self-pay | Admitting: Internal Medicine

## 2010-06-01 DIAGNOSIS — I428 Other cardiomyopathies: Secondary | ICD-10-CM

## 2010-06-19 LAB — DIFFERENTIAL
Eosinophils Absolute: 0.3 10*3/uL (ref 0.0–0.7)
Eosinophils Relative: 5 % (ref 0–5)
Lymphs Abs: 1.9 10*3/uL (ref 0.7–4.0)

## 2010-06-19 LAB — POCT I-STAT, CHEM 8
BUN: 24 mg/dL — ABNORMAL HIGH (ref 6–23)
Calcium, Ion: 1.12 mmol/L (ref 1.12–1.32)
Chloride: 103 mEq/L (ref 96–112)
Creatinine, Ser: 1.2 mg/dL (ref 0.4–1.5)
Glucose, Bld: 92 mg/dL (ref 70–99)
HCT: 39 % (ref 39.0–52.0)

## 2010-06-19 LAB — CBC
Hemoglobin: 12.7 g/dL — ABNORMAL LOW (ref 13.0–17.0)
RBC: 4.58 MIL/uL (ref 4.22–5.81)
WBC: 6.3 10*3/uL (ref 4.0–10.5)

## 2010-06-19 LAB — COMPREHENSIVE METABOLIC PANEL
ALT: 17 U/L (ref 0–53)
AST: 19 U/L (ref 0–37)
CO2: 29 mEq/L (ref 19–32)
Calcium: 9 mg/dL (ref 8.4–10.5)
Chloride: 105 mEq/L (ref 96–112)
GFR calc Af Amer: >60 mL/min (ref >60)
GFR calc non Af Amer: >60 mL/min (ref >60)
Potassium: 3.9 mEq/L (ref 3.5–5.1)
Sodium: 139 mEq/L (ref 135–145)

## 2010-06-19 LAB — LIPID PANEL
Cholesterol: 122 mg/dL (ref 0–200)
HDL: 63 mg/dL (ref >39)
LDL Cholesterol: 54 mg/dL (ref 0–99)
Total CHOL/HDL Ratio: 1.9 RATIO
Triglycerides: 27 mg/dL (ref <150)
VLDL: 5 mg/dL (ref 0–40)

## 2010-06-19 LAB — POCT CARDIAC MARKERS: Troponin i, poc: <0.05 ng/mL (ref 0.00–0.09)

## 2010-06-19 LAB — LIPASE, BLOOD: Lipase: 31 U/L (ref 11–59)

## 2010-06-19 LAB — TROPONIN I: Troponin I: 0.03 ng/mL (ref 0.00–0.06)

## 2010-06-19 LAB — CK TOTAL AND CKMB (NOT AT ARMC)
CK, MB: 1.4 ng/mL (ref 0.3–4.0)
Relative Index: INVALID (ref 0.0–2.5)
Total CK: 62 U/L (ref 7–232)

## 2010-06-26 ENCOUNTER — Encounter: Payer: Self-pay | Admitting: *Deleted

## 2010-07-06 NOTE — Letter (Signed)
Summary: Remote Device Check  [employer], Main Office  [address]   [place], [place] [address]   Phone: [phone]  Fax: [phone]     June 26, 2010 MRN: [id number]   [facility] [address] [place], [place]  [address]   Dear Mr. Dasaro,   Your remote transmission was recieved and reviewed by your physician.  All diagnostics were within normal limits for you.  __X____Your next office visit is scheduled for:   June 2012 with Dr Ladona Ridgel. Please call our office to schedule an appointment.    Sincerely,  Vella Kohler

## 2010-07-06 NOTE — Cardiovascular Report (Signed)
Summary: Office Visit   Office Visit   Imported By: Roderic Ovens 06/27/2010 15:52:09  _____________________________________________________________________  External Attachment:    Type:   Image     Comment:   External Document

## 2010-07-17 LAB — CBC
HCT: 42.5 % (ref 39.0–52.0)
Hemoglobin: 14.4 g/dL (ref 13.0–17.0)
MCV: 84.2 fL (ref 78.0–100.0)
Platelets: 224 10*3/uL (ref 150–400)
RDW: 15.3 % (ref 11.5–15.5)

## 2010-07-17 LAB — BASIC METABOLIC PANEL
BUN: 12 mg/dL (ref 6–23)
Chloride: 103 mEq/L (ref 96–112)
Glucose, Bld: 94 mg/dL (ref 70–99)
Potassium: 4.1 mEq/L (ref 3.5–5.1)
Sodium: 139 mEq/L (ref 135–145)

## 2010-07-24 ENCOUNTER — Other Ambulatory Visit (HOSPITAL_COMMUNITY)
Admission: RE | Admit: 2010-07-24 | Discharge: 2010-07-24 | Disposition: A | Discharge status: Home / Self Care | Payer: PRIVATE HEALTH INSURANCE | Source: Ambulatory Visit | Attending: Oncology | Admitting: Oncology

## 2010-07-24 ENCOUNTER — Encounter (HOSPITAL_BASED_OUTPATIENT_CLINIC_OR_DEPARTMENT_OTHER): Payer: PRIVATE HEALTH INSURANCE | Admitting: Oncology

## 2010-07-24 ENCOUNTER — Other Ambulatory Visit: Payer: Self-pay | Admitting: Oncology

## 2010-07-24 DIAGNOSIS — I2510 Atherosclerotic heart disease of native coronary artery without angina pectoris: Secondary | ICD-10-CM

## 2010-07-24 DIAGNOSIS — Z950 Presence of cardiac pacemaker: Secondary | ICD-10-CM

## 2010-07-24 DIAGNOSIS — N3010 Interstitial cystitis (chronic) without hematuria: Secondary | ICD-10-CM

## 2010-07-24 DIAGNOSIS — C182 Malignant neoplasm of ascending colon: Secondary | ICD-10-CM

## 2010-07-24 LAB — URINALYSIS, MICROSCOPIC - CHCC
Ketones: NEGATIVE mg/dL
Nitrite: NEGATIVE
Protein: NEGATIVE mg/dL
Specific Gravity, Urine: 1.02 (ref 1.003–1.035)
WBC, UA: NEGATIVE (ref 0–2)
pH: 5 (ref 4.6–8.0)

## 2010-07-26 LAB — CYTOLOGY, URINE

## 2010-08-03 ENCOUNTER — Encounter: Payer: Self-pay | Admitting: Internal Medicine

## 2010-08-04 ENCOUNTER — Encounter: Payer: Self-pay | Admitting: Internal Medicine

## 2010-08-04 ENCOUNTER — Ambulatory Visit (INDEPENDENT_AMBULATORY_CARE_PROVIDER_SITE_OTHER): Payer: PRIVATE HEALTH INSURANCE | Admitting: Internal Medicine

## 2010-08-04 DIAGNOSIS — I509 Heart failure, unspecified: Secondary | ICD-10-CM

## 2010-08-04 DIAGNOSIS — E785 Hyperlipidemia, unspecified: Secondary | ICD-10-CM

## 2010-08-04 DIAGNOSIS — I2510 Atherosclerotic heart disease of native coronary artery without angina pectoris: Secondary | ICD-10-CM

## 2010-08-05 NOTE — Progress Notes (Signed)
HPI DENTIFICATION:  Jake Carter is a 65 year old gentleman with a history of CAD (status post MI with stent to the LAD in 2005), perfusion study in 2007 which showed an apical infarction, EF of 52.  He underwent repeat angiography that showed a 25% lesion before the stent and patent stent. 30% diagonal, otherwise, no significant disease.  RCA was non-dominant. EF was 25-30%  He was hospitalized last December with CP.  Rx medically. I saw him in clinic in December.  When I saw him in Dec he was being evaluated for possible gallbladder problems.  HIDA scan was negative He was seen by B. Buccini.  Rx'd for reflux.  Doing well since.  Has been off his antireflux meds. Since seen he has done well from a cardiac standpoint.  He denies chest pain.  He remains very active, exercising daily. He denies significant dizziness.  NO signif SOB. Allergies  Allergen Reactions  . Promethazine Hcl     Current Outpatient Prescriptions  Medication Sig Dispense Refill  . ALPRAZolam (XANAX) 0.25 MG tablet as needed.        Marland Kitchen aspirin 81 MG EC tablet 2 tabs by mouth daily       . atorvastatin (LIPITOR) 20 MG tablet Take 20 mg by mouth daily.        . clopidogrel (PLAVIX) 75 MG tablet Take 75 mg by mouth daily.        . metoprolol tartrate (LOPRESSOR) 25 MG tablet Take 1/2 tablet once daily.       . nitroGLYCERIN (NITROSTAT) 0.4 MG SL tablet Place 0.4 mg under the tongue every 5 (five) minutes as needed. For up to 3 doses.       Marland Kitchen venlafaxine (EFFEXOR-XR) 37.5 MG 24 hr capsule 3 times per week.         Past Medical History  Diagnosis Date  . Cardiomyopathy   . CAD (coronary artery disease)   . Dyslipidemia   . 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 ICD (implantable cardiac defibrillator) in place     ICD - Guidant  . Depression     Past Surgical History  Procedure Date  . Cardiac defibrillator placement 5/07    Guidant ICD placement  . Cardiac defibrillator placement    [employer] - Remote - yes    Family History  Problem Relation Age of Onset  . Cancer Father     died at 33  . Cancer Mother     died at 88  . Cancer Brother     died at 54  . Cancer Sister     died at 12    History   Social History  . Marital Status: Married    Spouse Name: N/A    Number of Children: N/A  . Years of Education: N/A   Occupational History  . Not on file.   Social History Main Topics  . Smoking status: Former Games developer  . Smokeless tobacco: Not on file   Comment: former 74  . Alcohol Use: Yes  . Drug Use: Not on file  . Sexually Active: Not on file   Other Topics Concern  . Not on file   Social History Narrative  . No narrative on file    Review of Systems:  All systems reviewed.  They are negative to the above problem except as previously stated.  Vital Signs: BP 96/60  Pulse 58  Ht 5\' 7"  (1.702 m)  Wt 165  lb (74.844 kg)  BMI 25.84 kg/m2  Physical Exam  HEENT:  Normocephalic, atraumatic. EOMI, PERRLA.  Neck: JVP is normal. No thyromegaly. No bruits.  Lungs: clear to auscultation. No rales no wheezes.  Heart: Regular rate and rhythm. Normal S1, S2. No S3.   No significant murmurs. PMI not displaced.  Abdomen:  Supple, nontender. Normal bowel sounds. No masses. No hepatomegaly.  Extremities:   Good distal pulses throughout. No lower extremity edema.  Musculoskeletal :moving all extremities.  Neuro:   alert and oriented x3.  CN II-XII grossly intact.  EKG:  Sinus bradycardia.  58 bpm.  Anterolateral MI.  Assessment and Plan:

## 2010-08-05 NOTE — Assessment & Plan Note (Signed)
Keep on same regimen. 

## 2010-08-05 NOTE — Assessment & Plan Note (Signed)
Doing well.  NO symptoms to suggest angina.  Volume status looks very good.  Keep on same regimen.

## 2010-08-05 NOTE — Assessment & Plan Note (Addendum)
Volume status looks good.  Will get echo on next visit.  Due for f/u with EP.

## 2010-08-22 NOTE — Op Note (Signed)
[name], Jake Carter            ACCOUNT NO.:  [id number]   MEDICAL RECORD NO.:  [id number]          PATIENT TYPE:  AMB   LOCATION:  ENDO                         FACILITY:  [facility]   PHYSICIAN:  Bernette Redbird, M.D.   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   PROCEDURE:  The procedure was familiar to the patient from prior  examinations and he provided written consent after coming as an  outpatient to the [facility].  Sedation was MAC by the  anesthesia department.  This procedure was performed immediately  following his upper endoscopy.   Digital exam of the prostate was normal.   The Pentax adult video colonoscope was advanced around the colon without  difficulty to the ileal anastomosis as identified by __________.   Some patchy erythema was noted in the left colon, with some slight  irregularity exudate and erythema __________ .  This began in the region  of left flexure and went down to the sigmoid region.  These changes were  very mild.  __________           ______________________________  Bernette Redbird, M.D.     RB/MEDQ  D:  09/23/2008  T:  09/23/2008  Job:  [id number]   cc:   Francoise Schaumann. [place], DO, FAAP  Fax: [phone]   Leighton Roach Truett Perna, M.D.  Fax: [phone]

## 2010-08-22 NOTE — Op Note (Signed)
Jake Carter, Jake Carter            ACCOUNT NO.:  [id number]   MEDICAL RECORD NO.:  [id number]          PATIENT TYPE:  AMB   LOCATION:  ENDO                         FACILITY:  [facility]   PHYSICIAN:  Bernette Redbird, M.D.   DATE OF BIRTH:  1945/09/16   DATE OF PROCEDURE:  09/23/2008  DATE OF DISCHARGE:                               OPERATIVE REPORT   PROCEDURE:  Upper endoscopy.   INDICATIONS:  Lynch syndrome in a 65 year old with history of colon  cancer.   FINDINGS:  Small hiatal hernia.  No evidence of duodenal adenomas.   PROCEDURE:  The procedure was familiar to the patient who provided  written consent after coming as an outpatient to the [facility]  Endoscopy Unit.  Sedation was MAC by the anesthesia team.  The Pentax  video endoscope was passed under direct vision, entering the esophagus  without significant difficulty.  The esophagus was endoscopically  normal, without evidence of Barrett's esophagus, reflux esophagitis,  varices, infection, neoplasia or any ring or stricture.  A 1-2 cm hiatal  hernia was present.   The stomach was entered.  It contained no significant residual.  There  was a little bit of patchy erythema in the prepyloric region as might be  expected from him being on aspirin and Plavix.  No erosive changes or  ulcers were seen and no polyps or masses were observed anywhere in the  stomach including a retroflexed view of the cardia.  The pylorus,  duodenal bulb, second duodenum, and what I believe was the proximal  third portion of the duodenum were all inspected twice and no lesions  were observed.  The scope was then removed from the patient.  He  tolerated the procedure well and there were no apparent complications.   IMPRESSION:  1. Lynch syndrome, with essentially negative upper endoscopy and in      particular, no duodenal abnormalities identified.  2. Mild antral gastritis, probably secondary to aspirin.   PLAN:  Proceed to colonoscopic  evaluation.           ______________________________  Bernette Redbird, M.D.     RB/MEDQ  D:  09/23/2008  T:  09/23/2008  Job:  [id number]   cc:   Francoise Schaumann. [place], DO, FAAP  Fax: [phone]   Leighton Roach Truett Perna, M.D.  Fax: [phone]

## 2010-08-22 NOTE — Assessment & Plan Note (Signed)
[facility]                            CARDIOLOGY OFFICE NOTE   NAME:Jake Carter, Jake Carter                 MRN:          [id number]  DATE:08/30/2006                            DOB:          1945-12-19    PATIENT IDENTIFICATION:  Jake Carter is a 65 year old gentleman with a  history of CAD (status post MI and stent in 2005), LVF of 25-30%. I last  saw him in clinic in February.   In the interval, he called last week and said he had been having more  dizziness and actually came off his medicines, metoprolol, Diovan and  Flomax.   The patient otherwise was feeling about the size just some dizziness,  still doing his 2 miles of walking. No chest pain, no PND, no syncope.   MEDICATIONS:  1. Plavix 75 daily.  2. Lipitor 20 daily.  3. Aspirin 325 daily.  4. Prevacid p.r.n.  5. Effexor 37.5 two q. Week.   REVIEW OF SYSTEMS:  Patient feeling better.   PHYSICAL EXAMINATION:  GENERAL:  The patient is in no distress.  VITAL SIGNS:  Blood pressure 108/70, pulse 68, weight 162. Orthostatics,  blood pressure laying 104/60, pulse 59, sitting 100/60, pulse 63,  standing at 0 minutes 98/60, pulse 67, at 2 minutes 100/62, pulse 65, at  5 minutes 100/60, pulse 61.  NECK:  JVP is normal.  LUNGS:  Clear. No rales or wheezes.  CARDIAC:  Regular rate and rhythm, S1, S2, no S3. No murmurs.  ABDOMEN:  Benign.  EXTREMITIES:  No edema.   IMPRESSION:  1. Hypotension. I am not sure what exacerbated a change. His blood      pressure is always on the lower side but to now not be on anything      because it was in the 80s before, I am not sure why. Not      orthostatic on exam. I will check a BMET, CBC and a TSH. I will      hold off on the cortisol pending the labs, especially since he is      improved.  2. Coronary artery disease, asymptomatic, still walking, would      continue.  3. Dyslipidemia. Continue on current regimen.   I told the patient today to add  back his metoprolol ER 12.5 daily and I  would like to see him in one month's time and reassess.     Pricilla Riffle, MD, [facility]  Electronically Signed    PVR/MedQ  DD: 08/30/2006  DT: 08/30/2006  Job #: [id number]   cc:   Francoise Schaumann. [place], DO, FAAP

## 2010-08-22 NOTE — H&P (Signed)
Jake Carter, Jake Carter        ACCOUNT NO.:  [id number]   MEDICAL RECORD NO.:  [id number]          PATIENT TYPE:  EMS   LOCATION:  MAJO                         FACILITY:  [facility]   PHYSICIAN:  Nicolasa Ducking, ANP DATE OF BIRTH:  1945-05-20   DATE OF ADMISSION:  03/25/2007  DATE OF DISCHARGE:                              HISTORY & PHYSICAL   PRIMARY CARDIOLOGIST:  Dr. Dietrich Pates.   PRIMARY EP:  [name].   PRIMARY CARE Keyera Hattabaugh:  Francoise Schaumann. [place], DO, FAAP.   PATIENT PROFILE:  A 65 year old Caucasian male with history of CAD and  ischemic cardiomyopathy, who presents with recurrent chest pain.   PROBLEM LIST:  1. Chest pain/CAD.      a.     November 12, 2003.  PCI of a 95% stenosis in the LAD, with       placement of a 2.5 x 24-mm Costar (study sent DES).      b.     July 18, 2004 and again July 18, 2005, cardiac       catheterization revealing patent LAD stent with otherwise       nonobstructive disease.  EF 35% to 40% with severe anterior antral       apical and infra-apical akinesis.  2. Ischemic cardiomyopathy/chronic systolic congestive heart failure.      a.     Status post Guidant Vitality single-lead AICD Aug 07, 2005 by       Dr. Ladona Ridgel.      b.     April 25, 2006, 2-D echocardiogram EF 30%, distal septal -       apical and distal lateral akinesis with global hypokinesis.  3. History of nonsustained VT.  4. Hyperlipidemia.  5. Colon cancer with diagnosis of Lynch syndrome.      a.     Status post right hemicolectomy 2006.      b.     Undergoing to 77-month survey of colonoscopies and yearly       abdominal CTs.  6. History hoarseness with left vocal fold lesion, status post      excision July 2008.   HISTORY OF PRESENT ILLNESS:  A 65 year old Caucasian male with history  of CAD and ischemic cardiomyopathy status post PCI the LAD with drug-  eluting stent August 2005 and single AICD placement May 2007.  He is  very active at home walking two miles a day on the  treadmill without  limitations.  This morning at approximately 9:30 a.m., he developed mild  4-5/10 retrosternal chest tightness and twinging, without  associated  symptoms, initially lasting approximately 20 to 30 minutes before  resolving spontaneously.  Symptoms have since been intermittent and 40,  lasting less than 1 minute, and he says symptoms are in no way similar  to his previous angina which was more  in line with the chest and throat, as well as bilateral arm crushing and  tightness.  He is presented to [facility] today where his  cardiac markers are negative x1.  He was treated with heparin and IV  nitroglycerin and eventually became pain free.  His IV nitro was  discontinued and he was transferred [facility] for further evaluation.  Currently, he is pain free.   ALLERGIES:  Sulfa causes GI upset.   HOME MEDICATIONS:  1. Plavix 75 daily.  2. Lipitor 20 daily.  3. Aspirin 325 daily.  4. Effexor 37.5 b.i.d.  5. Prevacid p.r.n.  6. Metoprolol 12.5 daily.  7. Vesicare 5 mg daily.   FAMILY HISTORY:  Mother died of cancer at 76.  Father died of cancer 91.  Sister died of cancer 35, and a brother died of cancer at 47.   SOCIAL HISTORY:  Lives in [place] with his wife and works as a  Therapist, nutritional.  He has about a 20 pack-year history of  tobacco abuse, quitting in October of 1981.  He has one to two glassed  of wine per week.  He exercises daily, walking about two miles a day at  the treadmill.   REVIEW OF SYSTEMS:  Positive for chest pain as outlined above.  Otherwise, all systems reviewed are negative.   PHYSICAL EXAM:  VITAL SIGNS:  Afebrile, his heart rate 55, respirations  15, blood pressure 124/72, pulse ox 100% on percent room air.  Pleasant  white male in no acute distress.  Awake, alert and oriented x3.  NECK:  No bruits or JVD.  LUNGS:  Respirations are regular, unlabored, clear to auscultation.  CARDIAC:  Regular S1-S2.  No S3 or 4  murmurs.  ABDOMEN:  Round, soft, nontender, nondistended.  Bowel sounds present  x4.  EXTREMITIES:  Warm, dry, pink.  No clubbing, cyanosis or edema.  Dorsalis pedis, posterior tibial pulses 2+ bilaterally.   Chest X-Ray shows no acute abnormalities.  EKG shows sinus bradycardia  at 55 beats per minute.  No left axis deviation, left anterior  fascicular block.  There is no acute ST-T changes compared to old ECG.   LAB WORK:  Hemoglobin 13.2, hematocrit 39.3, WBC 5.2, platelets 230,  sodium 138, potassium 3.8, chloride 101, CO2 30, BUN 18, creatinine  1.26, glucose 103, CK-MB less than 1.0, troponin-I less than 0.5,  calcium 9.5.   ASSESSMENT/PLAN:  1. Chest pain.  The patient presents with typical and atypical      features, not similar to previous angina.  Now pain free.  Plan to      admit and cycle cardiac markers.  Continue heparin, aspirin, beta      blocker, Statin.  If cardiac enzymes negative, plan to DC in the      a.m. and plan outpatient Myoview.  Cardiac enzymes positive or has      recurrent pain.  Plan cath.  2. Hyperlipidemia.  Check lipids and LFTs.  Continue Statin.  3. Ischemic cardiomyopathy/chronic systolic congestive heart failure.      Euvolemic on exam.  Continue beta blocker.  No ACE inhibitor or      ARB, secondary to history of hypotension when on a very low dose      Atacand in      the past.  4. History of colon cancer, followed at [facility] and at [facility] with q.      6 months surveillance colonoscopies and yearly CTs.      Nicolasa Ducking, ANP     CB/MEDQ  D:  03/25/2007  T:  03/26/2007  Job:  [id number]

## 2010-08-22 NOTE — Assessment & Plan Note (Signed)
[facility]                         ELECTROPHYSIOLOGY OFFICE NOTE   NAME:Jake Carter, Jake Carter                     MRN:          [id number]  DATE:06/29/2008                            DOB:          September 16, 1945    Mr. Dispenza returns today for followup.  He is a very pleasant middle-  aged male with an ischemic cardiomyopathy, nonsustained ventricular  tachycardia, class I heart failure who returns for followup.  He is  status post ICD insertion.  He has a Nurse, learning disability device placed in  May 2007.  He denies chest pain.  He denies shortness of breath.  He has  been traveling frequently and has been doing quite well otherwise.   MEDICINES:  1. Plavix 75 a day.  2. Lipitor 20 a day.  3. Aspirin 325 a day.  4. Metoprolol 12.5 daily.   PHYSICAL EXAMINATION:  GENERAL:  He is a pleasant, well-appearing,  middle-aged man in no acute distress.  VITAL SIGNS:  Blood pressure was 110/62, the pulse 64 and regular, the  respirations were 18.  Weight was 168 pounds.  NECK:  No jugular venous distention.  LUNGS:  Clear bilaterally to auscultation.  No wheezes, rales, or  rhonchi are present.  No increased work of breathing.  CARDIOVASCULAR:  Regular rate and rhythm.  Normal S1 and S2.  ABDOMEN:  Soft, nontender.  EXTREMITIES:  No edema.   Interrogation of his defibrillator demonstrates a Guidant Vitality.  The  R-waves were 12, the impedance 621, a threshold was 1 volt at 0.4.  Battery voltage was 3.04 volts.  Underlying rhythm was sinus at 60.  There were no intercurrent ICD therapies.   IMPRESSION:  1. Ischemic cardiomyopathy status post myocardial infarction.  2. Nonsustained ventricular tachycardia.  3. Status post implantable cardioverter-defibrillator insertion.   DISCUSSION:  Mr.  Gut is stable and his defibrillator is working  normally.  He has no heart failure to speak of, presently class I.  He  is having no anginal symptoms.  He will  continue his current medical  therapy.  I will see him back for ICD followup in 1 year.     Doylene Canning. Ladona Ridgel, MD  Electronically Signed    GWT/MedQ  DD: 06/29/2008  DT: 06/30/2008  Job #: [id number]   cc:   Francoise Schaumann. [place], DO, FAAP

## 2010-08-22 NOTE — Op Note (Signed)
[name], Jake Carter        ACCOUNT NO.:  [id number]   MEDICAL RECORD NO.:  [id number]          PATIENT TYPE:  AMB   LOCATION:  SDS                          FACILITY:  [facility]   PHYSICIAN:  Antony Contras, MD     DATE OF BIRTH:  05/11/1945   DATE OF PROCEDURE:  11/01/2006  DATE OF DISCHARGE:  11/01/2006                               OPERATIVE REPORT   PREOPERATIVE DIAGNOSES:  1. Hoarseness.  2. Left vocal fold lesion.   POSTOPERATIVE DIAGNOSIS:  1. Hoarseness.  2. Left vocal fold lesion.   PROCEDURE:  Suspended micro-direct laryngoscopy with left vocal fold  lesion excision.   SURGEON:  Antony Contras, M.D.   ANESTHESIA:  General endotracheal anesthesia.   COMPLICATIONS:  None.   INDICATIONS:  The patient is a 65 year old white male who had a left  vocal fold lesion noted recently on upper endoscopy.  He has been hoarse  for several months prior to this.  He presents to the operating room for  surgical excision.   FINDINGS:  The patient was found to have a pedunculated lesion on the  left vocal fold that was excised even with the mucosa of the vocal fold.  There was a little bit of white appearance to the surface but there was  no invasion grossly into the vocal fold.   DESCRIPTION OF PROCEDURE:  The patient was identified in the holding  room, informed consent having been obtained including a discussion of  risks, benefits, and alternatives. The patient was moved to the  operating suite and placed on the operating table in a supine position.  Anesthesia was induced and the patient was intubated by the anesthesia  team without difficulty using a #6 laser safe tube.  The patient was  given intravenous steroids during the case.  The eyes were taped closed  and the bed was turned 90 degrees from anesthesia.  A tooth guard was  placed and the larynx was exposed using Dedo laryngoscope.  A 0 degrees  telescope was used to document the lesion.  An epinephrine soaked  pledget was held against the lesion for a couple of minutes.  The lesion  was then grasped with side biting cup forceps and retracted medially  while left curved scissors was used to excise the lesion including the  mucosa at the stalk of the lesion.  The lesion was passed to nursing for  pathology.  An epinephrine pledget was held against the surgical site.  The site was examined and the excision did not extend down into the  vocal ligament.  After this, an LTA was inserted and lidocaine was  injected into the airway.  The laryngoscope was then taken out of  suspension and removed from the patient's mouth while the airway was  suctioned.  The tooth guard was removed.  The patient was returned to  anesthesia for wake-up.  He was extubated and moved to the recovery room  in stable condition.      Antony Contras, MD  Electronically Signed     DDB/MEDQ  D:  11/01/2006  T:  11/01/2006  Job:  [id number]

## 2010-08-22 NOTE — Assessment & Plan Note (Signed)
[facility]                       [place] CARDIOLOGY OFFICE NOTE   NAME:Mcadams, WENGER                 MRN:          [id number]  DATE:07/17/2007                            DOB:          [birth year]    IDENTIFICATION:  [name] is a 65 year old gentleman with CAD,  cardiomyopathy, nonsustained VT (status post ICD).  I last saw him in  January.   When I saw him last we pulled back on the Atacand because he had some  dizziness.  He actually is feeling better on this.  He continues on  metoprolol at only 12.5 daily.   He remains very active.  He says he tires out by the end of the day but  he really is very busy during the day and does not notice limitation.   CURRENT MEDICATIONS:  1. Plavix 75.  2. Lipitor 20.  3. Aspirin 325.  4. Effexor 37.5 two weekly.  5. Metoprolol 12.5 daily.   PHYSICAL EXAM:  The patient is in no distress.  Blood pressure is 92/70,  pulse is 66 and regular.  Weight not taken - the patient did not want.  LUNGS:  Clear.  NECK:  JVP is normal.  CARDIAC EXAM:  Regular rate and rhythm.  S1 and S2.  No S3, no murmurs.  ABDOMEN:  Benign.  No hepatomegaly.  No masses.  EXTREMITIES:  Good distal pulses.  No edema.   IMPRESSION:  1. Cardiomyopathy.  Volume status looks good.  His blood pressure is      tenuous.  He felt not good on the Atacand even at 1 mg.  I would      keep him on metoprolol, again a low dose for now.  If his blood      pressure climbs, will push further.  2. Coronary artery disease status post percutaneous transluminal      coronary angioplasty and stent to the left anterior descending      artery.  Last catheterization 2007 showed 25% stenosis before the      stent, otherwise no significant disease.  3. History of nonsustained ventricular tachycardia, has internal      cardioverter-defibrillator.  4. Dyslipidemia.  Good lipid control back in the winter, would      continue on current  regimen.   I will set to see the patient back in August, sooner if symptoms  develop.  Encouraged him to stay active.  Glad to hear he is backing  down on weekend work.     Pricilla Riffle, MD, [facility]  Electronically Signed    PVR/MedQ  DD: 07/17/2007  DT: 07/18/2007  Job #: [id number]   cc:   Francoise Schaumann. [place], DO, FAAP

## 2010-08-22 NOTE — Assessment & Plan Note (Signed)
[facility]                         ELECTROPHYSIOLOGY OFFICE NOTE   NAME:Villagomez, SODERQUIST                 MRN:          [id number]  DATE:01/14/2007                            DOB:          08/12/1945    Mr. Dumais returns today for followup.  He is a very pleasant middle-  aged man with ischemic cardiomyopathy and congestive heart failure and  nonsustained ventricular tachycardia, who is status post ICD insertion.  He returns today for followup.  The patient has recently undergone  multiple medical procedures but has been stable otherwise.  His main  complaint is that he does have problems with hypotension.   PHYSICAL EXAMINATION:  His blood pressure was 94/60 today with a pulse  of 68.  Respirations were 16.  The weight was 169.  NECK:  No jugular venous distention.  LUNGS:  Clear bilaterally to auscultation.  There are no wheezes, rales  or rhonchi.  CARDIOVASCULAR:  Regular rate and rhythm with a normal S1 and S2.  EXTREMITIES:  No edema.   MEDICATIONS:  1. Plavix 75 a day.  2. Lipitor 20 a day.  3. Aspirin 325 a day.  4. Effexor 37.5 two tablets daily.  5. Metoprolol 12.5 daily.  6. Atacand 1 mg daily.   Interrogation of his defibrillator demonstrates a Guidant Vitality T-  175.  The R waves were 10, the impedance 489 ohm. The threshold was 1  volt at 0.4.  The battery voltage was 3.2 volts.  There are no episodes  of VT.  He was not pacing in the ventricle.   IMPRESSION:  1. Ischemic cardiomyopathy.  2. Congestive heart failure.  3. Status post implantable cardioverter/defibrillator insertion.   DISCUSSION:  Overall, Mr. Urieta is stable. His defibrillator is  working normally.  We will see him back for ICD followup in one year,  and he will follow up in our [facility] program.     Doylene Canning. Ladona Ridgel, MD  Electronically Signed    GWT/MedQ  DD: 01/14/2007  DT: 01/15/2007  Job #: [id number]

## 2010-08-22 NOTE — Procedures (Signed)
NAMEMARCELINO, Jake Carter        ACCOUNT NO.:  [id number]   MEDICAL RECORD NO.:  [id number]          PATIENT TYPE:  OUT   LOCATION:  RAD                           FACILITY:  [facility]   PHYSICIAN:  Pricilla Riffle, MD, FACCDATE OF BIRTH:  September 20, 1945   DATE OF PROCEDURE:  DATE OF DISCHARGE:                                ECHOCARDIOGRAM   TESTS AND INDICATIONS:  The patient is a 65 year old with history of  CAD, LV dysfunction.   A 2D echo with echo Doppler.  Acoustic windows are somewhat difficult at  the apex, note contrast is used.   The left ventricle is normal in size, with end-diastolic dimension of 51  mm, the intraventricular septum is mildly thickened at 14 mm, posterior  wall at 12 mm.  Left atrium normal at 32 mm.  Right atrium and right  ventricle are normal in size.  Aortic root is normal at 34 mm.   The aortic valve is mildly sclerotic.  There is no insufficiency.  Mitral valve is mildly thickened with trace insufficiency.  Pulmonic  valve is normal with no insufficiency.  Tricuspid valve is normal with  trace insufficiency.   Overall LV systolic function appears to be depressed.  Again apical  windows are foreshortened in the two-chamber views.  LVF grossly is  depressed at approximately 30% with akinesis of the distal septal apical  distal lateral wall.  Hypokinesis elsewhere.  Note:  In the initial  images, there is echodensity at the apex, could not rule out artifact  versus clot; however, with IV echo contrast, the LV apex was clearly  seen, and there was no evidence of thrombus.   RV function is grossly normal.  Note:  RV catheter noted.   No pericardial effusion is seen.      Pricilla Riffle, MD, [facility]  Electronically Signed     PVR/MEDQ  D:  02/24/2007  T:  02/24/2007  Job:  [id number]   cc:   Francoise Schaumann. Milford Cage DO, FAAP  Fax: [phone]

## 2010-08-22 NOTE — Assessment & Plan Note (Signed)
[facility]                       [place] CARDIOLOGY OFFICE NOTE   NAME:[name], [name]                 MRN:          [id number]  DATE:02/24/2007                            DOB:          1946/02/07    IDENTIFICATION:  [name] is a 65 year old gentleman with ischemic  cardiomyopathy, coronary artery disease, nonsustained VT (status post  ICD).  I last saw him in October.   He comes in today.  He says he is a little more fatigued than usual.  He  usually plays 9 holes of golf; with a cart can do okay.  He has been  playing a little bit with his son; 18 holes.  He says at about 30 he  gets very tired, and after a game of 18, during the day he sleeps and  that night very well.  He is a little discouraged by this.  He says  usually he can do what he wants to do without a problem.  He is still  walking on the treadmill for 30 minutes average of about 3-1/2 miles per  hour, no problems.   He has noted a little bit nausea the past week, but his wife and son  also had the similar symptoms.  He denies any edema.  No PND.  No  orthopnea.  No chest pain.   CURRENT MEDICATIONS:  1. Plavix 75.  2. Lipitor 20.  3. Aspirin 325.  4. Effexor 37.5 two q. week  5. Prevacid p.r.n.  6. Metoprolol 12.5 q. day.  7. Atacand 1 mg.   PHYSICAL EXAMINATION:  Patient is in no distress.  Blood pressure 94/66, pulse 66 and regular, weight 163, down 6 pounds  from October.  LUNGS:  Clear.  CARDIAC:  Regular rate and rhythm; S1, S2.  No definite S3.  No  significant murmurs.  ABDOMEN:  No hepatomegaly.  NECK:  JVP is normal.  EXTREMITIES:  No edema.   IMPRESSION:  1. Cardiomyopathy.  Volume status actually looks good.  He has some      cramping in his legs at night, but there is no evidence for edema.      Potassium has been good.  I would continue to follow.  Again,      encouraged him to stay with his activity as he used to.  I have reviewed the echo  today, and I would like to get some additional  pictures.  His left ventricular function I am not convinced has changed.  Again, would like to review.  Shadow at the apex which may represent  artifact, but again, we will have the patient come back for additional  images.  1. Coronary artery disease, again I think is stable.  2. Status post implantable cardioverter-defibrillator with pacer      insertion.  3. Dyslipidemia, on Lipitor.  Last lipid panel in April was excellent;      LDL 60, HDL 51.   Tentatively set to see the patient back in 2 months' time, sooner if  problems develop.  He will call next week to see how he is doing.  Given  the flu and Pneumovax today.  Pricilla Riffle, MD, [facility]  Electronically Signed    PVR/MedQ  DD: 02/24/2007  DT: 02/25/2007  Job #: [id number]

## 2010-08-22 NOTE — Assessment & Plan Note (Signed)
 [facility]                       Jake Carter CARDIOLOGY OFFICE NOTE   NAME:Jake Carter, Jake Carter                 MRN:          [id number]  DATE:12/13/2006                            DOB:          1945/08/11    IDENTIFICATION:  Jake Carter is a 65 year old gentleman with a history  of CAD (status post MI with drug-eluting stent to the LAD in 2005.  Initially, LVEF was good but on recheck it was 25-30%).  I last saw him  back in June.   Since I saw him last, he has done well.  He denies dizziness.  He is  active.  No shortness of breath.  No chest pain.   He is off Flomax now.  He had his urologic procedure and also an ENT  procedure without problem.   CURRENT MEDICATIONS:  1. Plavix 75 daily.  2. Lipitor 20 daily.  3. Aspirin 325 daily.  4. Effexor 37.5 daily.  5. Prevacid weekly.  6. Metoprolol 12.5 daily.   PHYSICAL EXAMINATION:  Patient is in no distress.  Blood pressure is 109/69.  Pulse is 57 and regular.  Weight is 162.  LUNGS:  Clear.  NECK:  JVP is normal.  CARDIAC:  Regular rate and rhythm.  S1 and S2.  No S3.  ABDOMEN:  Benign.  EXTREMITIES:  No edema.   IMPRESSION:  1. Coronary artery disease/left ventricular dysfunction.  We will get      another echocardiogram to reconfirm his ejection fraction, given      that it has been up and down.  Clinically, he has been doing very      well.  I would try a low dose of an ARB.  Given him Atacand 4.  He      can break it into a quarter and then take a half.  Call if there      are problems.  Continue on the low-dose beta blockade.  Keep him on      Plavix and aspirin for now.  2. Dyslipidemia:  Last lipid panel was back in April.  Excellent      control.  LDL of 60.  HDL of 51.   I will set to see the back in the winter, sooner if problems develop.     Pricilla Riffle, MD, [facility]  Electronically Signed    PVR/MedQ  DD: 12/13/2006  DT: 12/13/2006  Job #: [id number]   cc:   Francoise Schaumann.  Halm, DO, FAAP

## 2010-08-22 NOTE — Op Note (Signed)
Jake Carter, [name]        ACCOUNT NO.:  [id number]   MEDICAL RECORD NO.:  [id number]          PATIENT TYPE:  AMB   LOCATION:  ENDO                         FACILITY:  [facility]   PHYSICIAN:  Bernette Redbird, M.D.   DATE OF BIRTH:  1945-04-18   DATE OF PROCEDURE:  09/17/2006  DATE OF DISCHARGE:                               OPERATIVE REPORT   PROCEDURE:  Upper endoscopy.   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   FINDINGS:  Mild gastritis consistent with aspirin therapy.   PROCEDURE:  The patient provided written consent for the procedure.  Sedation was fentanyl 100 mcg, Versed 10 mg and Phenergan 12.5 mg IV  prior to and during the course the procedure without arrhythmias or  desaturation.  The Pentax video endoscope was passed under direct  vision.   There was a small left vocal cord nodule suggestive of a small mucous  cyst, and some thickening of the posterior commissure of the larynx,  raising question of reflux laryngitis.   The esophagus itself was normal without evidence of reflux esophagitis,  Barrett's esophagus, varices, infection, neoplasia or any hiatal hernia  rings or stricture.   The stomach had prepyloric antral erythema, nonerosive, consistent with  aspirin exposure and there was also some patchy focal erythema along the  greater curve of the stomach.  No ulcers, erosions, polyps or masses  were observed in the stomach including a retroflexed view the cardia.   The pylorus, duodenal bulb and second duodenum looked normal.  The scope  was advanced as far as possible down the duodenum, entering what I  believe was the proximal to midportion of the third portion of the  duodenum.  No lesions were seen and although I did not visualize the  ampulla, inspection during pullout through that section of the duodenum  did not disclose any obvious intraluminal tumors.   The scope was removed from the patient.  He tolerated the procedure  well  and there were no apparent complications.   IMPRESSION:  1. 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  2. Vocal cord nodule and history of sore throat, raising question of      reflux laryngitis.  3. 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   PLAN:  1. ENT consultation.  2. The patient already uses Prevacid p.r.n.; I would recommend taking      it on a daily basis for possible reflux      laryngitis.  3. Follow-up endoscopy at whenever interval his oncologist feels is      appropriate, probably whenever he has a surveillance colonoscopy.           ______________________________  Bernette Redbird, M.D.     RB/MEDQ  D:  09/17/2006  T:  09/17/2006  Job:  [id number]   cc:   Francoise Schaumann. Raynelle Highland  Fax: [phone]   Dr. Efrain Sella, [phone], [facility] [place]   [place]. Dorethea Clan, MD  Fax: [phone]   Sandria Bales. Ezzard Standing, M.D.  [address]  [place]  [place] [address]

## 2010-08-22 NOTE — Assessment & Plan Note (Signed)
[facility]                       [place] CARDIOLOGY OFFICE NOTE   NAME:Jake Carter, Jake Carter                 MRN:          [id number]  DATE:10/03/2006                            DOB:          [birth year]    IDENTIFICATION:  Mr. Cumpton is a 65 year old gentleman with a history  of CAD (status post MI with stent, drug eluting, to the LAD in 2005, EF  initially was good, but is now done to 25-30% range with patent stent).  I last saw the patient back in May.   When I saw him last, he had some complaints of dizziness and we backed  down on his medicines. I placed him back on Metoprolol 12.5 mg daily.   In the interval, he has said that he has felt well. He is active as  always. He denies dizziness now. There is no shortness of breath, again,  not a problem. Blood pressure has averaged at 105/60s.   He does have some back pain that is pleuritic, left parasternal, left  lateral. He has had pleurisy in the past which is what this feels like,  but he denies any productive cough. There is no fever.   CURRENT MEDICATIONS:  1. Plavix 75 mg daily.  2. Lipitor 20 mg daily.  3. Aspirin 325 mg daily.  4. Effexor 3.75 mg 2 times per week.  5. Prevacid.  6. Metoprolol 12.5 mg.   REVIEW OF SYSTEMS:  The patient notes some hoarseness; seen by GI; put  on Prevacid, also to be seen by ENT.   The patient is also having workup by urology. He has question of an  abnormal cells. We will discuss with his oncologist, may indeed have to  a procedure done by Dr. Darvin Neighbours.   PHYSICAL EXAMINATION:  GENERAL:  The patient is in no distress.  VITAL SIGNS:  Blood pressure 98/59, pulse 56 and regular, weight 160.  NECK:  JVP is normal.  LUNGS:  Clear.  CHEST:  Mildly tender.  BACK:  Nontender.  CARDIAC:  Regular rate and rhythm, S1, S2, no definite S3.  EXTREMITIES:  No edema.   IMPRESSION:  1. Cardiomyopathy. Volume status looks good. If he does have a  procedure done, I recommend getting an echo to reconfirm his EF      that it has not changed. Again, his last one was in April 2007; EF      was 25-30%. Clinically, he has done so well though it would not      change how I am treating him.  2. Dyslipidemia. Continue on current regimen, excellent numbers.  3. Hypotension, improved.  4. Urologic. The patient may be considered for general anesthesia for      urologic procedure. From a cardiac standpoint, I think that he is      at a relatively low risk for an acute event. Note when he had his      procedure for his colon cancer done in 2006, he had just had the      stent placed within a year prior, question of whether he had a      transient event around  this time. Again, it is all hazy why his      stent is patent and he has a depressed LV function. Currently      though, his volume status is good. He is class 1 in his symptoms.      From a stent standpoint, I would try to keep him on aspirin. Plavix      can be held transiently if needed.  5. GI. Again, as noted above on Prevacid and to be seen by ENT.  6. Back pain. I am not sure what this represents. He had a CT scan      done. This is being evaluated, but reportedly showed nothing.   I will set follow up in three months sooner if problems develop. Again,  should urologic surgery be planned, we can check an echocardiogram to  reconfirm his current EF.   Note he has a guidant device that should be turned off if this done.     Pricilla Riffle, MD, [facility]  Electronically Signed    PVR/MedQ  DD: 10/03/2006  DT: 10/04/2006  Job #: [id number]   cc:   Francoise Schaumann. Halm, DO, FAAP  Bernette Redbird, M.D.  Ronald L. Earlene Plater, M.D.  Leighton Roach. Truett Perna, M.D.

## 2010-08-22 NOTE — Assessment & Plan Note (Signed)
[facility]                       [place] CARDIOLOGY OFFICE NOTE   NAME:Jake Carter, Jake Carter                 MRN:          [id number]  DATE:04/17/2007                            DOB:          Jun 22, 1945    IDENTIFICATION:  The patient is a 65 year old gentleman with a history  of an ischemic cardiomyopathy, CAD, nonsustained VT (status post ICD  placement).  I last saw him in November.   In the interval, he actually was hospitalized in December on the 16th  for evaluation of chest pain.  He ruled out for an MI and was sent home.  He thinks some of it may have been due to esophageal spasm.  He has  actually done well since.   He denies chest pain.  He remains active.  No significant shortness of  breath.   Current medications include:  1. Plavix 75.  2. Lipitor 20.  3. Aspirin 325.  4. Effexor 37.5 two times per week.  5. Prevacid p.r.n.  6. Metoprolol 12.5 daily.  7. Atacand 1 mg versus 2 mg daily.   PHYSICAL EXAMINATION:  On exam, the patient is in no acute distress.  His blood pressure a little better today, 110/70.  Pulse is 64 and  regular.  Weight not taken.  LUNGS:  Clear.  No rales.  CARDIAC EXAM:  Regular rate and rhythm.  S1, S2.  No S3.  NECK:  JVP is normal.  ABDOMEN:  No hepatomegaly.  EXTREMITIES:  No edema.   IMPRESSION:  1. Coronary artery disease.  I am not convinced the episodes of pain      were ischemic in etiology.  Question some subclinical reflux with      esophageal spasm.  I have told him to start his Prevacid again.  2. Cardiomyopathy.  Last echocardiogram, his LVEF was approximately      30%.  This was back in November.  Continue on current regimen.  I      would not press for now because his blood pressure has been on the      low side; keep at current dosing.  3,  Dyslipidemia.  Continue on Lipitor.   I will set to see the patient back in 3 months, sooner if problems  develop.   ADDENDUM:  Labs in the  hospital:  LDL 59, HDL 60.  AST, ALT normal.     Pricilla Riffle, MD, [facility]  Electronically Signed    PVR/MedQ  DD: 04/17/2007  DT: 04/18/2007  Job #: [id number]   cc:   Francoise Schaumann. Halm, DO, FAAP

## 2010-08-22 NOTE — Op Note (Signed)
Jake Carter, Jake Carter        ACCOUNT NO.:  [id number]   MEDICAL RECORD NO.:  [id number]          PATIENT TYPE:  AMB   LOCATION:  DAY                          FACILITY:  [facility]   PHYSICIAN:  Ronald L. Earlene Plater, M.D.  DATE OF BIRTH:  16-Oct-1945   DATE OF PROCEDURE:  10/16/2006  DATE OF DISCHARGE:                               OPERATIVE REPORT   PREOPERATIVE DIAGNOSES:  Urgency, frequency and a positive NMP-22.   POSTOPERATIVE DIAGNOSES:  Urgency, frequency and a positive NMP-22.   OPERATIVE PROCEDURE:  Cystourethroscopy, bilateral retrograde ureteral  pyelograms and barbotage bladder cytologies.   SURGEON:  Dr. Gaynelle Arabian.   ANESTHESIA:  LMA.   ESTIMATED BLOOD LOSS:  Negligible.   TUBES:  None.   COMPLICATIONS:  None.   INDICATIONS FOR PROCEDURE:  Mr. Cuevas  is a very nice 65 year old,  white male who presented with a long history of urgency, frequency and  some dysuria.  He underwent a workup including an office  cystourethroscopy that revealed no significant problems and his CT scan  was without significant abnormalities, there was a question of  interstitial cystitis but an NMP-22 was performed and was positive.  After understanding the risks, benefits and alternatives, it was elected  to proceed with the above procedure.   DESCRIPTION OF PROCEDURE:  The patient was placed in the supine position  and after proper LMA anesthesia was placed in the dorsal lithotomy  position, prepped and draped with Betadine in a sterile fashion.  Cystourethroscopy was performed with a 22.5 [place] Olympus panendoscope.  There was some tightness of the fossa navicularis which was opened with  the scope and dilated. The urethra was otherwise within normal limits.  There was moderate trilobar hypertrophy.  Grade 1 trabeculation was  noted of the bladder. The bladder was carefully inspected with 12 and 70  degree lenses and there were no lesions throughout. Barbotage bladder  cytologies  were obtained and were submitted for cytology and bilateral  retrograde ureteral pyelograms were performed with 6-French open-ended  catheters.  Both systems appeared to be totally normal and appeared to  drain well.  There were no filling defects or other abnormalities noted.  The bladder was drained.  The panendoscope was removed and the patient  was taken to the recovery room stable.   PROCEDURE:  Bilateral retrograde pyelograms.   DESCRIPTION OF PROCEDURE:  Utilizing a 6-French open-ended catheter,  bilateral retrograde ureteral pyelograms were performed and were noted  be within normal limits and drained well.      Ronald L. Earlene Plater, M.D.  Electronically Signed     RLD/MEDQ  D:  10/16/2006  T:  10/17/2006  Job:  [id number]

## 2010-08-22 NOTE — Discharge Summary (Signed)
NAMETREVION, HOBEN        ACCOUNT NO.:  [id number]   MEDICAL RECORD NO.:  [id number]          PATIENT TYPE:  INP   LOCATION:  4731                         FACILITY:  [facility]   PHYSICIAN:  Thomas C. Wall, MD, FACCDATE OF BIRTH:  02-Feb-1946   DATE OF ADMISSION:  03/25/2007  DATE OF DISCHARGE:  03/26/2007                               DISCHARGE SUMMARY   PRIMARY CARDIOLOGIST:  Dr. Dietrich Pates.   ELECTROPHYSIOLOGIST:  Dr. Gilman Schmidt.   PRIMARY CARE [place]:  Dr. Vivia Ewing.   DISCHARGE DIAGNOSIS:  Chest pain.   SECONDARY DIAGNOSES:  1. Coronary artery disease status post percutaneous coronary      intervention and stenting of the LAD in August 2005.  2. Ischemic cardiomyopathy, ejection fraction 30% by echocardiogram      November 2008.  3. Status post Guidant Vitality single lead AICD 5107.  4. History nonsustained ventricular tachycardia.  5. Hyperlipidemia.  6. Chronic systolic congestive heart failure.  7. 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  8. History of hoarseness status post left vocal fold lesion and      excision July 2008.   ALLERGIES:  SULFA CAUSES GI UPSET.   PROCEDURES:  None.   HISTORY OF PRESENT ILLNESS:  A 65 year old Caucasian male with above  problem list who was in his usual state of health until 9:30 a.m. on  December 16 when he had 4-5/10 retrosternal chest tightness and twinging  without associated symptoms, initially lasted 20-30 minutes before  resolving spontaneously.  Symptoms recurred intermittently throughout  the day and were fleeting in nature.  He presented to [facility] where ECG showed no acute changes.  First set of cardiac  markers were negative.  He was transferred to [facility] for further  evaluation.   HOSPITAL COURSE:  [name] has had no additional chest pain.  His  cardiac markers have remained negative, and we plan to discharge him  home today in satisfactory  condition and have arranged follow-up with  Dr. Tenny Craw.   DISCHARGE LABORATORY DATA:  Hemoglobin 12.6, hematocrit 37.5, WBC 5.6,  platelets 214, MCV 81.3.  Sodium 141, potassium 3.9, chloride 103, CO2  30, BUN 13, creatinine 0.13, glucose 92.  INR 1.2.  Total bilirubin 1.2,  alkaline phosphatase 41, AST 20, ALT 18, albumin 3.8.  Cardiac markers  negative x2.  Total cholesterol 124, triglycerides 25, HDL 60, LDL 59,  calcium 9.2.  TSH is pending.  BNP 130.   DISPOSITION:  The patient is being discharged home today in good  condition.   FOLLOW-UP PLANS AND APPOINTMENTS:  We have arranged for follow-up with  Dr. Dietrich Pates on April 17, 2007 at 3:30 p.m.  He is to follow up with  Dr. Milford Cage as previously scheduled.   DISCHARGE MEDICATIONS:  1. Plavix 75 mg daily.  2. Aspirin 325 mg daily.  3. Toprol XL 12.5 mg daily.  4. Lipitor 20 mg every night.  5. Vesicare 5 mg daily.  6. Prevacid p.r.n.  7. Effexor 37.5 mg as previously prescribed.  8. Nitroglycerin 0.4 mg sublingual p.r.n. chest pain.  OUTSTANDING LABORATORY STUDIES:  None.  TSH is pending.   The duration of discharge encounter 48 minutes including physician time.      Nicolasa Ducking, ANP      Jesse Sans. Daleen Squibb, MD, [facility]  Electronically Signed    CB/MEDQ  D:  03/26/2007  T:  03/26/2007  Job:  [id number]   cc:   Francoise Schaumann. Halm, DO, FAAP

## 2010-08-22 NOTE — Assessment & Plan Note (Signed)
[facility]                            CARDIOLOGY OFFICE NOTE   NAME:Jake Carter, Jake Carter                     MRN:          [id number]  DATE:01/08/2008                            DOB:          [birth year]    IDENTIFICATION:  [name] is a 65 year old gentleman with CAD,  cardiomyopathy, nonsustained VT (status post ICD) and dyslipidemia.  I  last saw him in April.   In the interval, he is doing very well.  He has cut back on some of his  work, but is still working and the stress load is less.  He denies  dizziness.  Says his pressures actually running a little better in the  low 100.  Denies chest pain, active, working up 30 minutes a day,  watching what he eats.   CURRENT MEDICINES:  1. Plavix 75.  2. Lipitor 20.  3. Aspirin 325.  4. Effexor 37.5 two times per week.  5. Metoprolol 12.5 mg daily.   PHYSICAL EXAMINATION:  GENERAL:  The patient is in no distress.  VITAL SIGNS;  Blood pressure is 103/66, pulse is 64 and regular, weight  is 165.  NECK:  No bruits.  JVP is normal.  CARDIAC:  Regular rate and rhythm.  S1 and S2, no S3.  No significant  murmurs.  ABDOMEN:  No hepatomegaly.  EXTREMITIES:  No edema.   IMPRESSION:  1. Cardiomyopathy.  Volume status looks very good.  I have tried very      hard to get him other medicines.  His blood pressures have been      very tenuous.  I will reflect back at his last notes before going      up on an ARB or ACE inhibitor.  I told him I would be back in touch      with him.  2. Coronary artery disease status post PTCA stent to the left anterior      descending.  Last catheterization showed patent stent with 25%      narrowing before the stent with what he has going on his left      ventricular function being down, I would continue on the Plavix and      switch him to a baby aspirin.  3. 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  4. Dyslipidemia.  Last lipid panel done on  September 9, showed total      cholesterol 138, LDL of 58, HDL 68 great would continue.   I will put follow up for March.  Again, I told him I would be in touch  after I review his previous records regarding additional meds.     Pricilla Riffle, MD, [facility]  Electronically Signed    PVR/MedQ  DD: 01/08/2008  DT: 01/09/2008  Job #: [id number]   cc:   Francoise Schaumann. Halm, DO, FAAP

## 2010-08-22 NOTE — Op Note (Signed)
Jake Carter, Jake Carter        ACCOUNT NO.:  [id number]   MEDICAL RECORD NO.:  [id number]          PATIENT TYPE:  AMB   LOCATION:  ENDO                         FACILITY:  [facility]   PHYSICIAN:  Bernette Redbird, M.D.   DATE OF BIRTH:  Dec 25, 1945   DATE OF PROCEDURE:  09/17/2006  DATE OF DISCHARGE:                               OPERATIVE REPORT   PROCEDURE:  Colonoscopy.   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  His most recent surveillance colonoscopy, one year ago, was negative for  polyps.   FINDINGS:  Normal exam to the ileocolonic anastomosis.   DESCRIPTION OF PROCEDURE:  The nature, purpose, and risks of the  procedure were familiar to the patient from prior examination and he  provided written consent.  Sedation for this procedure and the upper  endoscopy which preceded it totaled fentanyl 150 mcg, Versed 15 mg and  Phenergan 25 mg IV without clinical instability.   Digital exam of the prostate was grossly normal.   The Pentax pediatric video colonoscope was advanced with some looping  around the colon to the ileocolonic anastomosis and pullback was then  performed.  The quality of the prep was excellent and it is felt that  all areas were well seen.   This was a normal examination.  No polyps, cancer, colitis, vascular  malformations, or diverticulosis were noted.  Retroflexion in the rectum  was unremarkable.  No biopsies were obtained.  He tolerated the  procedure well and there were no apparent complications.   IMPRESSION:  Normal surveillance colonoscopy in a patient with Lynch  syndrome and a personal history of colon cancer two years ago.   PLAN:  Follow-up endoscopy and colonoscopy in one year unless his  oncologist suggests otherwise.           ______________________________  Bernette Redbird, M.D.     RB/MEDQ  D:  09/17/2006  T:  09/17/2006  Job:   [id number]   cc:   Francoise Schaumann. Raynelle Highland  Fax: [phone]   Farris Has. Dorethea Clan, MD  Fax: [phone]   Sandria Bales. Ezzard Standing, M.D.  [address]  [place]  [place] [address]   Leighton Roach. Truett Perna, M.D.  Fax: [phone]

## 2010-08-25 NOTE — Letter (Signed)
January 11, 2006    Vivia Ewing, MD  [address]  [place], [place] [address]   RE:  Jake Carter, Jake Carter  MRN:  [id number]  /  DOB:  December 04, 1945   Dear Brett Canales:   Jake Carter was seen in the office today at his request for left hand  paresthesias.  He describes numbness that began in the fourth and fifth  fingertips and has spread to the other fingers.  This is constant and mild.  There was no weakness nor clumsiness in the hand.  Duration has been perhaps  a week.  He has no recent injuries to the left upper extremity or shoulder.  He was concerned that this could be related to his heart disease or  defibrillator.   On exam, pleasant gentleman in no acute distress.  The weight is not recorded.  Blood pressure 90/60, heart rate 62 and  regular, respirations 16.  NECK:  No jugular venous distension.  LUNGS:  Clear.  CARDIAC:  Normal first and second heart sounds; fourth heart sound present.  NEUROLOGIC:  Sensation, strength, and tone are normal.   IMPRESSION:  Jake Carter has atypical neurological symptoms that do not fit  an anatomic distribution for either carpal tunnel or radicular symptoms from  potential cervical spine disease.  It is also atypical for peripheral  neuropathy, occurring in just 1 extremity.  He has not had anything to  suggest a thoracic outlet syndrome.  I recommended continued observation for  a month.  If symptoms persist or worsen, he will contact you for appropriate  testing.  I will plan to see him again in 9 months.   Sincerely,     Gerrit Friends. Dietrich Pates, MD, [facility]    RMR/MedQ  /  Job #:  [id number] DD:  01/11/2006 / DT:  01/13/2006

## 2010-08-25 NOTE — Cardiovascular Report (Signed)
Jake Carter, Jake Carter        ACCOUNT NO.:  [id number]   MEDICAL RECORD NO.:  [id number]          PATIENT TYPE:  OIB   LOCATION:  1962                         FACILITY:  [facility]   PHYSICIAN:  Rollene Rotunda, M.D.   DATE OF BIRTH:  Jan 17, 1946   DATE OF PROCEDURE:  07/18/2005  DATE OF DISCHARGE:  07/18/2005                              CARDIAC CATHETERIZATION   PRIMARY CARE PHYSICIAN:  Francoise Schaumann. Halm, DO, FAAP   CARDIOLOGIST:  Vida Roller, M.D.   PROCEDURES:  1.  Left heart catheterization.  2.  Coronary arteriography.   INDICATIONS:  Evaluate patient with chest discomfort, previous coronary  disease with anterior infarct and stenting of his LAD in 2005.  He had a  stress perfusion study which demonstrated anterior-anteroapical large  infarct with reduced ejection fraction of approximately 33%. This was new  compared to post stenting left ventriculograms.   PROCEDURE NOTE:  Left heart catheterization was performed via the right  femoral artery.  The artery was cannulated using anterior wall puncture.  A  #4-French arterial sheath was inserted via the modified Seldinger technique.  Preformed Judkins and pigtail catheters were utilized.  The patient  tolerated the procedure well and left the lab in stable condition.   RESULTS:   HEMODYNAMICS:  LV 125/15, 124/93.   CORONARY ARTERIES:  Left main was short and normal.   The LAD is large, wrapping the apex.  There were diffuse luminal  irregularities.  There was 25% stenosis before the stent.  The mid stent was  widely patent.  There was a diagonal that was moderate size with ostial 30%  stenosis.   The circumflex was a dominant vessel and large.  In the A-V groove, it was  normal.  There was a ramus intermediate which was small and normal.  There  was a mid obtuse marginal which was moderate size normal. There was a  posterolateral which was large and normal.  The PDA was moderate size with  luminal irregularities.   The right coronary artery was nondominant.  There were luminal  irregularities.   LEFT VENTRICULOGRAM:  The left ventriculogram was obtained in the RAO  projection.  The EF was 35 to 40% with severe anterior, anteroapical, and  inferoapical akinesis.   CONCLUSION:  1.  Nonobstructive residual coronary artery disease with a patent left      anterior descending artery stent.  2.  Moderate to severely reduced ejection fraction.  The reason for the      change in the ejection fraction with his patent stent is not entirely      clear.  The question is whether he had transient thrombosis of the stent      versus spasm of the vessel.  He will be managed aggressively for      secondary risk reduction.  He will need to be considered for implantable      defibrillator placement. We will get an echocardiogram to further      evaluate his ejection fraction. We will get followup very soon with Dr.      Dorethea Clan.  He will need to have his medicines titrated and  an ACE      inhibitor eventually added.  For now, I will increase his Toprol from      12.5 to 25.  He can continue to have medication titration.           ______________________________  Rollene Rotunda, M.D.     JH/MEDQ  D:  07/18/2005  T:  07/18/2005  Job:  [id number]   cc:   Vida Roller, M.D.  Fax: [phone]   Francoise Schaumann. Milford Cage DO, FAAP  Fax: [phone]

## 2010-09-05 ENCOUNTER — Encounter: Payer: Self-pay | Admitting: Internal Medicine

## 2010-09-05 ENCOUNTER — Ambulatory Visit (INDEPENDENT_AMBULATORY_CARE_PROVIDER_SITE_OTHER): Payer: PRIVATE HEALTH INSURANCE | Admitting: Internal Medicine

## 2010-09-05 DIAGNOSIS — Z95810 Presence of automatic (implantable) cardiac defibrillator: Secondary | ICD-10-CM

## 2010-09-05 DIAGNOSIS — I428 Other cardiomyopathies: Secondary | ICD-10-CM

## 2010-09-05 DIAGNOSIS — I509 Heart failure, unspecified: Secondary | ICD-10-CM

## 2010-09-05 NOTE — Assessment & Plan Note (Signed)
His device is working normally. We'll recheck in several months. 

## 2010-09-05 NOTE — Patient Instructions (Signed)
Your physician wants you to follow-up in: 12 months with Dr. Taylor. You will receive a reminder letter in the mail two months in advance. If you don't receive a letter, please call our office to schedule the follow-up appointment.    

## 2010-09-05 NOTE — Progress Notes (Signed)
HPI [name] returns today for followup. He is a pleasant middle-aged man with an ischemic cardiomyopathy, class II congestive heart failure, status post ICD implantation. He denies chest pain or shortness of breath. He's had no ICD shocks. Allergies  Allergen Reactions  . Promethazine Hcl      Current Outpatient Prescriptions  Medication Sig Dispense Refill  . ALPRAZolam (XANAX) 0.25 MG tablet as needed.        Marland Kitchen aspirin 81 MG EC tablet 2 tabs by mouth daily       . atorvastatin (LIPITOR) 20 MG tablet Take 20 mg by mouth daily.        . clopidogrel (PLAVIX) 75 MG tablet Take 75 mg by mouth daily.        . metoprolol tartrate (LOPRESSOR) 25 MG tablet Take 1/2 tablet once daily.       . nitroGLYCERIN (NITROSTAT) 0.4 MG SL tablet Place 0.4 mg under the tongue every 5 (five) minutes as needed. For up to 3 doses.       Marland Kitchen venlafaxine (EFFEXOR-XR) 37.5 MG 24 hr capsule 3 times per week.          Past Medical History  Diagnosis Date  . Cardiomyopathy   . CAD (coronary artery disease)   . Dyslipidemia   . 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 ICD (implantable cardiac defibrillator) in place     ICD - Guidant  . Depression     ROS:   All systems reviewed and negative except as noted in the HPI.   Past Surgical History  Procedure Date  . Cardiac defibrillator placement 5/07    Guidant ICD placement  . Cardiac defibrillator placement     [employer] - Remote - yes     Family History  Problem Relation Age of Onset  . Cancer Father     died at 81  . Cancer Mother     died at 52  . Cancer Brother     died at 63  . Cancer Sister     died at 21     History   Social History  . Marital Status: Married    Spouse Name: N/A    Number of Children: N/A  . Years of Education: N/A   Occupational History  . Not on file.   Social History Main Topics  . Smoking status: Former Games developer  . Smokeless tobacco: Not on file   Comment: former  65  . Alcohol Use: Yes  . Drug Use: Not on file  . Sexually Active: Not on file   Other Topics Concern  . Not on file   Social History Narrative  . No narrative on file     BP 104/65  Pulse 57  Resp 16  Ht 5\' 7"  (1.702 m)  Wt 167 lb (75.751 kg)  BMI 26.16 kg/m2  Physical Exam:  Well appearing NAD HEENT: Unremarkable Neck:  No JVD, no thyromegally Lymphatics:  No adenopathy Back:  No CVA tenderness Lungs:  Clear. Well-healed ICD incision. HEART:  Regular rate rhythm, no murmurs, no rubs, no clicks Abd:  Flat, positive bowel sounds, no organomegally, no rebound, no guarding Ext:  2 plus pulses, no edema, no cyanosis, no clubbing Skin:  No rashes no nodules Neuro:  CN II through XII intact, motor grossly intact  DEVICE  Normal device function.  See PaceArt for details.   Assess/Plan:

## 2010-09-05 NOTE — Assessment & Plan Note (Signed)
His symptoms are well controlled. He is instructed to maintain a low-sodium diet. He'll continue his current medications.

## 2010-10-08 DIAGNOSIS — Z8711 Personal history of peptic ulcer disease: Secondary | ICD-10-CM

## 2010-10-08 HISTORY — DX: Personal history of peptic ulcer disease: Z87.11

## 2010-10-13 ENCOUNTER — Other Ambulatory Visit: Payer: Self-pay | Admitting: Gastroenterology

## 2010-12-07 ENCOUNTER — Ambulatory Visit (INDEPENDENT_AMBULATORY_CARE_PROVIDER_SITE_OTHER): Payer: PRIVATE HEALTH INSURANCE | Admitting: *Deleted

## 2010-12-07 ENCOUNTER — Other Ambulatory Visit: Payer: Self-pay | Admitting: Internal Medicine

## 2010-12-07 DIAGNOSIS — I428 Other cardiomyopathies: Secondary | ICD-10-CM

## 2010-12-08 LAB — REMOTE ICD DEVICE
CHARGE TIME: 11.8 s
DEV-0020ICD: NEGATIVE
FVT: 0
PACEART VT: 0
TOT-0006: 20120524000000
TZAT-0001SLOWVT: 2
TZAT-0013FASTVT: 2
TZAT-0013SLOWVT: 2
TZON-0003FASTVT: 300 ms
TZON-0003SLOWVT: 352.9 ms
TZST-0001FASTVT: 3
TZST-0001FASTVT: 6
TZST-0001FASTVT: 7
TZST-0001SLOWVT: 3
TZST-0001SLOWVT: 6
TZST-0001SLOWVT: 7
TZST-0003FASTVT: 31 J
TZST-0003FASTVT: 31 J
TZST-0003FASTVT: 31 J
TZST-0003SLOWVT: 17 J
TZST-0003SLOWVT: 26 J
TZST-0003SLOWVT: 31 J
TZST-0003SLOWVT: 31 J
VENTRICULAR PACING ICD: 0 pct
VF: 0

## 2010-12-20 ENCOUNTER — Encounter: Payer: Self-pay | Admitting: *Deleted

## 2010-12-21 NOTE — Progress Notes (Signed)
ICD checked by remote. 

## 2011-01-12 LAB — CARDIAC PANEL(CRET KIN+CKTOT+MB+TROPI)
CK, MB: 1.3
CK, MB: 1.3
Relative Index: 1.2
Total CK: 107
Total CK: 125
Troponin I: 0.02
Troponin I: 0.03

## 2011-01-12 LAB — COMPREHENSIVE METABOLIC PANEL
ALT: 18
Albumin: 3.8
Alkaline Phosphatase: 41
BUN: 13
Calcium: 9.2
Potassium: 3.9
Sodium: 141
Total Protein: 6.6

## 2011-01-12 LAB — I-STAT 8, (EC8 V) (CONVERTED LAB)
BUN: 20
Chloride: 107
Glucose, Bld: 90
HCT: 41
Hemoglobin: 13.9
Operator id: 288831
Potassium: 5
Sodium: 138

## 2011-01-12 LAB — DIFFERENTIAL
Basophils Relative: 0
Eosinophils Absolute: 0.1 — ABNORMAL LOW
Eosinophils Relative: 3
Neutrophils Relative %: 60

## 2011-01-12 LAB — CBC
HCT: 39.3
MCHC: 33.5
MCV: 80.6
Platelets: 214
Platelets: 230
RDW: 15.9 — ABNORMAL HIGH
RDW: 15.8 — ABNORMAL HIGH

## 2011-01-12 LAB — BASIC METABOLIC PANEL
BUN: 18
CO2: 30
Chloride: 101
Creatinine, Ser: 1.26
Glucose, Bld: 103 — ABNORMAL HIGH
Potassium: 3.8

## 2011-01-12 LAB — TSH: TSH: 1.935

## 2011-01-12 LAB — B-NATRIURETIC PEPTIDE (CONVERTED LAB): Pro B Natriuretic peptide (BNP): 130 — ABNORMAL HIGH

## 2011-01-12 LAB — POCT CARDIAC MARKERS
Myoglobin, poc: 75.8
Operator id: 208401
Troponin i, poc: <0.05

## 2011-01-12 LAB — LIPID PANEL: Triglycerides: 25

## 2011-01-12 LAB — HEPARIN LEVEL (UNFRACTIONATED): Heparin Unfractionated: 1.36 — ABNORMAL HIGH

## 2011-01-12 LAB — PROTIME-INR: Prothrombin Time: 15.4 — ABNORMAL HIGH

## 2011-01-12 LAB — APTT: aPTT: >200

## 2011-01-22 LAB — CBC
Hemoglobin: 12.9 — ABNORMAL LOW
MCHC: 33.7
RBC: 4.72
RDW: 14.7 — ABNORMAL HIGH

## 2011-01-22 LAB — PROTIME-INR
INR: 1.1
Prothrombin Time: 14.5

## 2011-01-22 LAB — BASIC METABOLIC PANEL
CO2: 29
Calcium: 9.4
Creatinine, Ser: 1.14
GFR calc Af Amer: >60
GFR calc non Af Amer: >60
Sodium: 137

## 2011-01-22 LAB — APTT: aPTT: 34

## 2011-01-23 LAB — BASIC METABOLIC PANEL
CO2: 31
Calcium: 9.5
Creatinine, Ser: 1.09
GFR calc non Af Amer: >60
Glucose, Bld: 95

## 2011-01-23 LAB — PROTIME-INR
INR: 1.1
Prothrombin Time: 13.9

## 2011-01-23 LAB — URINALYSIS, ROUTINE W REFLEX MICROSCOPIC
Bilirubin Urine: NEGATIVE
Hgb urine dipstick: NEGATIVE
Nitrite: NEGATIVE
Protein, ur: NEGATIVE
Specific Gravity, Urine: 1.008
Urobilinogen, UA: 0.2

## 2011-01-23 LAB — CBC
MCHC: 33.9
Platelets: 267
RDW: 15 — ABNORMAL HIGH

## 2011-01-23 LAB — APTT: aPTT: 29

## 2011-01-25 ENCOUNTER — Telehealth: Payer: Self-pay | Admitting: Internal Medicine

## 2011-01-25 NOTE — Telephone Encounter (Signed)
Pt said he is not gonna wait to be seen either we find him a sooner appt or he will find another provider

## 2011-01-25 NOTE — Telephone Encounter (Signed)
I spoke with Lela regarding the patient. She states he was calling due to the recall letter he received in the mail. He is in recalls for October, and she made him aware that letters go out 2 months in advance. Per the patient, he just received his letter this week. Dr. Tenny Craw' schedule is out to December. The patient was quite upset and stated if he had to wait until December, he would find someone else. I will forward to [facility] and Dr. Tenny Craw to review. He is also needing and echo done.

## 2011-01-26 ENCOUNTER — Other Ambulatory Visit: Payer: Self-pay | Admitting: *Deleted

## 2011-01-26 DIAGNOSIS — I509 Heart failure, unspecified: Secondary | ICD-10-CM

## 2011-01-26 NOTE — Telephone Encounter (Signed)
Called patient and advised that Dr.Ross can see him on 10/22 at 830 am to check in and have echo done at 930am same day.

## 2011-01-29 ENCOUNTER — Encounter: Payer: Self-pay | Admitting: Internal Medicine

## 2011-01-29 ENCOUNTER — Ambulatory Visit (HOSPITAL_COMMUNITY): Payer: PRIVATE HEALTH INSURANCE | Attending: Pediatrics | Admitting: Radiology

## 2011-01-29 ENCOUNTER — Ambulatory Visit (INDEPENDENT_AMBULATORY_CARE_PROVIDER_SITE_OTHER): Payer: PRIVATE HEALTH INSURANCE | Admitting: Internal Medicine

## 2011-01-29 DIAGNOSIS — I2510 Atherosclerotic heart disease of native coronary artery without angina pectoris: Secondary | ICD-10-CM | POA: Insufficient documentation

## 2011-01-29 DIAGNOSIS — I509 Heart failure, unspecified: Secondary | ICD-10-CM

## 2011-01-29 DIAGNOSIS — E785 Hyperlipidemia, unspecified: Secondary | ICD-10-CM | POA: Insufficient documentation

## 2011-01-29 DIAGNOSIS — I059 Rheumatic mitral valve disease, unspecified: Secondary | ICD-10-CM | POA: Insufficient documentation

## 2011-01-29 NOTE — Assessment & Plan Note (Signed)
No symptoms to suggest angina.  Follow.  Keep on same meds for now.

## 2011-01-29 NOTE — Patient Instructions (Signed)
Your physician wants you to follow-up in:  6 months. You will receive a reminder letter in the mail two months in advance. If you don't receive a letter, please call our office to schedule the follow-up appointment.   

## 2011-01-29 NOTE — Assessment & Plan Note (Signed)
Volume status looks good.  Repeat echo today.   He has been difficult to advance with medicines as he always has gotten dizzy when ACE I added.

## 2011-01-29 NOTE — Assessment & Plan Note (Signed)
Continue meds.  Will check lipids.

## 2011-01-29 NOTE — Progress Notes (Signed)
HPIof CAD (status post MI with stent to the LAD in 2005), perfusion study in 2007  which showed an apical infarction, EF of 33. He underwent repeat  angiography that showed a 25% lesion before the stent and patent stent.  30% diagonal, otherwise, no significant disease. RCA was non-dominant.  EF was 25-30%  He was hospitalized last December with CP. Rx medically.  I saw him in clinic in December.  Since seen he has done well from a cardiac standpoint. He denies chest pain. He remains very active, exercising daily.  He denies significant dizziness. NO signif SOB.  He had a small drop in his Hgb and some stomach pains.  Seen by B Buccini.  Put back on antireflux meds.  Doing better Hgb improved Allergies      Allergies  Allergen Reactions  . Promethazine Hcl     Current Outpatient Prescriptions  Medication Sig Dispense Refill  . ALPRAZolam (XANAX) 0.25 MG tablet as needed.        Marland Kitchen aspirin 81 MG EC tablet Take 81 mg by mouth daily. 1 tabs by mouth daily      . atorvastatin (LIPITOR) 20 MG tablet Take 20 mg by mouth daily.        . clopidogrel (PLAVIX) 75 MG tablet Take 75 mg by mouth daily.        . lansoprazole (PREVACID) 30 MG capsule Take 30 mg by mouth daily.        . metoprolol tartrate (LOPRESSOR) 25 MG tablet Take 1/2 tablet once daily.       . nitroGLYCERIN (NITROSTAT) 0.4 MG SL tablet Place 0.4 mg under the tongue every 5 (five) minutes as needed. For up to 3 doses.       . Probiotic Product (PROBIOTIC FORMULA) CAPS Take by mouth.        . venlafaxine (EFFEXOR-XR) 37.5 MG 24 hr capsule 3 times per week.         Past Medical History  Diagnosis Date  . Cardiomyopathy   . CAD (coronary artery disease)   . Dyslipidemia   . 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 ICD (implantable cardiac defibrillator) in place     ICD - Guidant  . Depression     Past Surgical History  Procedure Date  . Cardiac defibrillator placement 5/07    Guidant ICD  placement  . Cardiac defibrillator placement     [employer] - Remote - yes    Family History  Problem Relation Age of Onset  . Cancer Father     died at 79  . Cancer Mother     died at 9  . Cancer Brother     died at 64  . Cancer Sister     died at 40    History   Social History  . Marital Status: Married    Spouse Name: N/A    Number of Children: N/A  . Years of Education: N/A   Occupational History  . Not on file.   Social History Main Topics  . Smoking status: Former Games developer  . Smokeless tobacco: Not on file   Comment: former 22  . Alcohol Use: Yes  . Drug Use: Not on file  . Sexually Active: Not on file   Other Topics Concern  . Not on file   Social History Narrative  . No narrative on file    Review of Systems:  All systems reviewed.  They are negative  to the above problem except as previously stated. Occasional pains in buttocks, thights. Vital Signs: BP 113/69  Pulse 60  Ht 5\' 7"  (1.702 m)  Wt 171 lb (77.565 kg)  BMI 26.78 kg/m2  Physical Exam  Patient is in NAD HEENT:  Normocephalic, atraumatic. EOMI, PERRLA.  Neck: JVP is normal. No thyromegaly. No bruits.  Lungs: clear to auscultation. No rales no wheezes.  Heart: Regular rate and rhythm. Normal S1, S2. No S3.   No significant murmurs. PMI not displaced.  Abdomen:  Supple, nontender. Normal bowel sounds. No masses. No hepatomegaly.  Extremities:   Good distal pulses throughout. No lower extremity edema.  Musculoskeletal :moving all extremities.  Neuro:   alert and oriented x3.  CN II-XII grossly intact.  EKG:  SR  61.  Anterolateral MI.  Assessment and Plan:

## 2011-02-05 ENCOUNTER — Telehealth: Payer: Self-pay | Admitting: *Deleted

## 2011-02-05 NOTE — Telephone Encounter (Signed)
LMOM with Dr.Steven Helm's office at [phone] and asked them to fax his last Lipid Panel.

## 2011-03-13 ENCOUNTER — Other Ambulatory Visit: Payer: Self-pay | Admitting: Internal Medicine

## 2011-03-15 ENCOUNTER — Other Ambulatory Visit: Payer: Self-pay | Admitting: Internal Medicine

## 2011-03-15 ENCOUNTER — Encounter: Payer: Self-pay | Admitting: Internal Medicine

## 2011-03-15 ENCOUNTER — Ambulatory Visit (INDEPENDENT_AMBULATORY_CARE_PROVIDER_SITE_OTHER): Payer: PRIVATE HEALTH INSURANCE | Admitting: *Deleted

## 2011-03-15 DIAGNOSIS — I428 Other cardiomyopathies: Secondary | ICD-10-CM

## 2011-03-21 LAB — REMOTE ICD DEVICE
BATTERY VOLTAGE: 2.59 V
BRDY-0002RV: 40 {beats}/min
CHARGE TIME: 12 s
HV IMPEDENCE: 43 Ohm
RV LEAD IMPEDENCE ICD: 605 Ohm
TZAT-0001FASTVT: 2
TZAT-0001SLOWVT: 2
TZAT-0013FASTVT: 2
TZAT-0018FASTVT: NEGATIVE
TZAT-0018SLOWVT: NEGATIVE
TZST-0001FASTVT: 3
TZST-0001FASTVT: 4
TZST-0001FASTVT: 7
TZST-0001SLOWVT: 5
TZST-0001SLOWVT: 7
TZST-0003FASTVT: 31 J
TZST-0003FASTVT: 31 J
TZST-0003FASTVT: 31 J
TZST-0003SLOWVT: 26 J
TZST-0003SLOWVT: 31 J
VENTRICULAR PACING ICD: 0 pct
VF: 0

## 2011-03-29 NOTE — Progress Notes (Signed)
remote icd check

## 2011-05-16 ENCOUNTER — Encounter (HOSPITAL_COMMUNITY): Payer: Self-pay | Admitting: *Deleted

## 2011-05-16 ENCOUNTER — Emergency Department (HOSPITAL_COMMUNITY)
Admission: EM | Admit: 2011-05-16 | Discharge: 2011-05-16 | Disposition: A | Discharge status: Home / Self Care | Payer: PRIVATE HEALTH INSURANCE | Attending: Emergency Medicine | Admitting: Emergency Medicine

## 2011-05-16 ENCOUNTER — Other Ambulatory Visit: Payer: Self-pay

## 2011-05-16 ENCOUNTER — Emergency Department (HOSPITAL_COMMUNITY): Payer: PRIVATE HEALTH INSURANCE

## 2011-05-16 DIAGNOSIS — F3289 Other specified depressive episodes: Secondary | ICD-10-CM | POA: Insufficient documentation

## 2011-05-16 DIAGNOSIS — I472 Ventricular tachycardia, unspecified: Secondary | ICD-10-CM | POA: Insufficient documentation

## 2011-05-16 DIAGNOSIS — R1013 Epigastric pain: Secondary | ICD-10-CM | POA: Insufficient documentation

## 2011-05-16 DIAGNOSIS — I2510 Atherosclerotic heart disease of native coronary artery without angina pectoris: Secondary | ICD-10-CM | POA: Insufficient documentation

## 2011-05-16 DIAGNOSIS — I428 Other cardiomyopathies: Secondary | ICD-10-CM | POA: Insufficient documentation

## 2011-05-16 DIAGNOSIS — F329 Major depressive disorder, single episode, unspecified: Secondary | ICD-10-CM | POA: Insufficient documentation

## 2011-05-16 DIAGNOSIS — Z95810 Presence of automatic (implantable) cardiac defibrillator: Secondary | ICD-10-CM | POA: Insufficient documentation

## 2011-05-16 DIAGNOSIS — I4729 Other ventricular tachycardia: Secondary | ICD-10-CM | POA: Insufficient documentation

## 2011-05-16 DIAGNOSIS — R110 Nausea: Secondary | ICD-10-CM | POA: Insufficient documentation

## 2011-05-16 DIAGNOSIS — Z87891 Personal history of nicotine dependence: Secondary | ICD-10-CM | POA: Insufficient documentation

## 2011-05-16 DIAGNOSIS — E785 Hyperlipidemia, unspecified: Secondary | ICD-10-CM | POA: Insufficient documentation

## 2011-05-16 DIAGNOSIS — Z85038 Personal history of other malignant neoplasm of large intestine: Secondary | ICD-10-CM | POA: Insufficient documentation

## 2011-05-16 DIAGNOSIS — R109 Unspecified abdominal pain: Secondary | ICD-10-CM

## 2011-05-16 LAB — CBC
MCH: 26.6 pg (ref 26.0–34.0)
MCHC: 32.7 g/dL (ref 30.0–36.0)
Platelets: 246 10*3/uL (ref 150–400)
RBC: 4.66 MIL/uL (ref 4.22–5.81)
RDW: 13.9 % (ref 11.5–15.5)

## 2011-05-16 LAB — DIFFERENTIAL
Basophils Absolute: 0 10*3/uL (ref 0.0–0.1)
Basophils Relative: 1 % (ref 0–1)
Eosinophils Absolute: 0.3 10*3/uL (ref 0.0–0.7)
Eosinophils Relative: 5 % (ref 0–5)
Lymphs Abs: 2 10*3/uL (ref 0.7–4.0)
Neutrophils Relative %: 50 % (ref 43–77)

## 2011-05-16 LAB — LIPASE, BLOOD: Lipase: 33 U/L (ref 11–59)

## 2011-05-16 LAB — TROPONIN I: Troponin I: <0.3 ng/mL (ref <0.30)

## 2011-05-16 LAB — BASIC METABOLIC PANEL
Calcium: 9.8 mg/dL (ref 8.4–10.5)
GFR calc Af Amer: 77 mL/min — ABNORMAL LOW (ref >90)
GFR calc non Af Amer: 66 mL/min — ABNORMAL LOW (ref >90)
Potassium: 3.8 mEq/L (ref 3.5–5.1)
Sodium: 137 mEq/L (ref 135–145)

## 2011-05-16 MED ORDER — PANTOPRAZOLE SODIUM 40 MG IV SOLR
40.0000 mg | Freq: Once | INTRAVENOUS | Status: AC
  Administered 2011-05-16: 40 mg via INTRAVENOUS
  Filled 2011-05-16: qty 40

## 2011-05-16 NOTE — ED Notes (Signed)
Pt c/o epigastric pain and nausea since 0300. Pt was told to come to ED by his GI doctor. Pt has taken Prevacid, mylanta and tums with no relief.

## 2011-05-16 NOTE — ED Provider Notes (Signed)
This chart was scribed for [employer]. Colon Branch, MD by Wallis Mart. The patient was seen in room APA14/APA14 and the patient's care was started at 4:20 PM.    CSN: [id number]  Arrival date & time 05/16/11  1501   First MD Initiated Contact with Patient 05/16/11 1554      Chief Complaint  Patient presents with  . Abdominal Pain    (Consider location/radiation/quality/duration/timing/severity/associated sxs/prior treatment) HPI Jake Carter is a 66 y.o. male who presents to the Emergency Department complaining of sudden onset,  persistence of constant severe epigastric pain that woke him up at 3 AM last night.Pt c/o associated nausea.  Pt had a similar episode 14 months ago when found to have gastric ulcers and was advised to come to the ED by his GI doctor.   Pt describes pain as stabbing and states that pain fluctuates with intensity from 9 to 4 but pain has currently stopped (only feels a slight burn and pressure)   Pt took Prevacid last night with no relief. Pt drank Pepsi and took Mylanta earlier today.  Eating does not aggravate the pain, although pt states he has been eating lighter.   Pt was taking  Cephalexin for a cut on his leg and finished dosage 24 hours ago.   Pt w/ h/o Cardiomyopathy, CAD Cardiology: Dr. Tenny Craw Corinda [place]ub[place]) PCP: Birder Robson GI: Buccini  Past Medical History  Diagnosis Date  . Cardiomyopathy   . CAD (coronary artery disease)   . Dyslipidemia   . 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 ICD (implantable cardiac defibrillator) in place     ICD - Guidant  . Depression   . Cancer     Past Surgical History  Procedure Date  . Cardiac defibrillator placement 5/07    Guidant ICD placement  . Cardiac defibrillator placement     [employer] - Remote - yes  . Abdominal surgery     Family History  Problem Relation Age of Onset  . Cancer Father     died at 81  . Cancer Mother     died at 26  . Cancer Brother     died at 76   . Cancer Sister     died at 11    History  Substance Use Topics  . Smoking status: Former Games developer  . Smokeless tobacco: Not on file   Comment: former 2  . Alcohol Use: Yes      Review of Systems 10 Systems reviewed and are negative for acute change except as noted in the HPI.  Allergies  Promethazine hcl  Home Medications   Current Outpatient Rx  Name Route Sig Dispense Refill  . ALPRAZOLAM 0.25 MG PO TABS  as needed.      . ASPIRIN 81 MG PO TBEC Oral Take 81 mg by mouth daily. 1 tabs by mouth daily    . ATORVASTATIN CALCIUM 20 MG PO TABS Oral Take 20 mg by mouth daily.      Marland Kitchen CLOPIDOGREL BISULFATE 75 MG PO TABS Oral Take 75 mg by mouth daily.      Marland Kitchen LANSOPRAZOLE 30 MG PO CPDR Oral Take 30 mg by mouth daily.      Marland Kitchen METOPROLOL TARTRATE 25 MG PO TABS  Take 1/2 tablet once daily.     Marland Kitchen NITROSTAT 0.4 MG SL SUBL  PLACE 1 TABLET UNDER TONGUE EVERY 5     MINUTES FOR 3 DOSES AS NEEDED. 25 tablet 12  .  PROBIOTIC FORMULA PO CAPS Oral Take by mouth.      . VENLAFAXINE HCL ER 37.5 MG PO CP24  3 times per week.       BP 110/72  Pulse 63  Temp(Src) 97.1 F (36.2 C) (Oral)  Resp 18  Ht 5\' 7"  (1.702 m)  Wt 168 lb (76.204 kg)  BMI 26.31 kg/m2  SpO2 98%  Physical Exam  Nursing note and vitals reviewed. Constitutional: He is oriented to person, place, and time. He appears well-developed and well-nourished. No distress.  HENT:  Head: Normocephalic and atraumatic.  Eyes: EOM are normal. Pupils are equal, round, and reactive to light.  Neck: Normal range of motion. Neck supple. No tracheal deviation present.  Cardiovascular: Normal rate and regular rhythm.   Pulmonary/Chest: Effort normal and breath sounds normal. No respiratory distress.  Abdominal: Soft. Bowel sounds are normal. He exhibits no distension. There is no tenderness. There is no rebound and no guarding.  Musculoskeletal: Normal range of motion. He exhibits no edema.  Neurological: He is alert and oriented to  person, place, and time. No sensory deficit.  Skin: Skin is warm and dry.  Psychiatric: He has a normal mood and affect. His behavior is normal.    ED Course  Procedures (including critical care time) DIAGNOSTIC STUDIES: Oxygen Saturation is 98% on room air, normal by my interpretation.    COORDINATION OF CARE: 4:30: EDP on phone with pt's GI MD 4:30 Spoke with Dr. 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 He recommended [place] for gall bladder again. If negative, continue with increased PPI dosing (Prevacid 30 mg BID) and he will follow the patient. Most likely will required EGD for further evaluation. 4:38: EDP at pt bedside to discuss current plan of treatment: ultrasound 6:24: EDP at bedside, discuss results of labwork with pt and plan for discharge Results for orders placed during the hospital encounter of 05/16/11  CBC      Component Value Range   WBC 5.6  4.0 - 10.5 (K/uL)   RBC 4.66  4.22 - 5.81 (MIL/uL)   Hemoglobin 12.4 (*) 13.0 - 17.0 (g/dL)   HCT 96.0 (*) 45.4 - 52.0 (%)   MCV 81.3  78.0 - 100.0 (fL)   MCH 26.6  26.0 - 34.0 (pg)   MCHC 32.7  30.0 - 36.0 (g/dL)   RDW 09.8  11.9 - 14.7 (%)   Platelets 246  150 - 400 (K/uL)  DIFFERENTIAL      Component Value Range   Neutrophils Relative 50  43 - 77 (%)   Neutro Abs 2.8  1.7 - 7.7 (K/uL)   Lymphocytes Relative 36  12 - 46 (%)   Lymphs Abs 2.0  0.7 - 4.0 (K/uL)   Monocytes Relative 8  3 - 12 (%)   Monocytes Absolute 0.5  0.1 - 1.0 (K/uL)   Eosinophils Relative 5  0 - 5 (%)   Eosinophils Absolute 0.3  0.0 - 0.7 (K/uL)   Basophils Relative 1  0 - 1 (%)   Basophils Absolute 0.0  0.0 - 0.1 (K/uL)  BASIC METABOLIC PANEL      Component Value Range   Sodium 137  135 - 145 (mEq/L)   Potassium 3.8  3.5 - 5.1 (mEq/L)   Chloride 99  96 - 112 (mEq/L)   CO2 30  19 - 32 (mEq/L)   Glucose,  Bld 86  70 - 99 (mg/dL)   BUN 18  6  - 23 (mg/dL)   Creatinine, Ser 1.61  0.50 - 1.35 (mg/dL)   Calcium 9.8  8.4 - 09.6 (mg/dL)   GFR calc non Af Amer 66 (*) >90 (mL/min)   GFR calc Af Amer 77 (*) >90 (mL/min)  LIPASE, BLOOD      Component Value Range   Lipase 33  11 - 59 (U/L)  TROPONIN I      Component Value Range   Troponin I <0.30  <0.30 (ng/mL)   US Abdomen Complete  05/16/2011  *RADIOLOGY REPORT*  Clinical Data:  Abdominal pain.  History of colon carcinoma.  COMPLETE ABDOMINAL ULTRASOUND  Comparison:  CT 09/09/2006  Findings:  Gallbladder:  No shadowing gallstones or echogenic sludge.  No gallbladder wall thickening or pericholecystic fluid.  Negative sonographic Murphy's sign according to the ultrasound technologist.  Common bile duct:  4.4 mm diameter, unremarkable  Liver:  No focal lesion identified.  Within normal limits in parenchymal echogenicity.  IVC:  Appears normal.  Pancreas:  No focal abnormality seen.  Spleen:  5.7 cm craniocaudal length, unremarkable  Right Kidney:  10.3 cm. No hydronephrosis.  Well-preserved cortex. Normal size and parenchymal echotexture without focal abnormalities.  Left Kidney:    9.7 cm. No hydronephrosis.  Well-preserved cortex. Normal size and parenchymal echotexture without focal abnormalities.  Abdominal aorta:  No aneurysm identified.  IMPRESSION:  1.  Negative gallbladder. 2.  No liver lesion evident.  Original Report Authenticated By: Osa Craver, M.D.    Date: 05/16/2011  1519  Rate:60  Rhythm: normal sinus rhythm  QRS Axis: left  Intervals: normal  ST/T Wave abnormalities: normal  Conduction Disutrbances:none  Narrative Interpretation:anterior infarct, age undetermined   Old EKG Reviewed: none available   MDM  Patient with abdominal pain that has been intermittent since 3:00 AM. No focal findings on exam. Labs are unremarkable. [place] is negative for acute gall bladder findings. Consult with GI who recommended increase in PPI and follow up with him when patient returns  from planned trip.Given IVF, PPI with improvement. Patient has been pain free since arrival. Plan to discharge on double dose PPI. The patient appears reasonably screened and/or stabilized for discharge and I doubt any other medical condition or other [facility] requiring further screening, evaluation, or treatment in the ED at this time prior to discharge.  I personally performed the services described in this documentation, which was scribed in my presence. The recorded information has been reviewed and considered.  MDM Reviewed: nursing note and vitals Interpretation: labs and ultrasound Consults: gastrointestinal       Nicoletta Dress. Colon Branch, MD 05/16/11 1904

## 2011-06-14 ENCOUNTER — Ambulatory Visit (INDEPENDENT_AMBULATORY_CARE_PROVIDER_SITE_OTHER): Payer: PRIVATE HEALTH INSURANCE | Admitting: Family Medicine

## 2011-06-14 ENCOUNTER — Encounter: Payer: Self-pay | Admitting: Family Medicine

## 2011-06-14 VITALS — Ht 67.0 in | Wt 170.3 lb

## 2011-06-14 DIAGNOSIS — I2510 Atherosclerotic heart disease of native coronary artery without angina pectoris: Secondary | ICD-10-CM

## 2011-06-14 NOTE — Patient Instructions (Addendum)
Increase vegetables: -increase vegetables to at least 2 x per day.  -add to dinner--brocolli.  -add to lunch-carrots, celery, tomatoes, etc -make a list of all of the vegetables that you currently use and like -make a list of vegetables that you would like to try.  Don't skip lunch!  Make sure you eat enough- without increasing your calories.   It is important to eat 3 meals per day. Plus, 1-2 snacks per day.    Goals is to eat 5-9 fruits and vegetables per day!  ;)  Switch to the natural peanut butter.   Continue to remain active!!!!  Please schedule in Nutrition Clinic for March 28th.

## 2011-06-14 NOTE — Progress Notes (Signed)
S: Pt here today for visit to discuss health nutrition in the setting of medical problems:  CAD, CHF, Hyperlipidemia.  Pt states, "I want to lose weight."  Had weight problems as a kid and has continued to struggle with this. Lost a large amount of weight and has started to gain weight again.  This makes him depressed.  Goal weight- 155-160.  "This is the first time in my life that I can't lose weight"  24 hour recall: Breakfast- Cup of coffee- 16 oz-  1/4 cup of milk added to coffee Cup of fiber one Banana 10oz almond milk/skim milk mixed.  Sliced almonds- 3 tablespoons  Snack-adkins oatmeal raisin bar Flavored water- 0 calorie- 10oz  Lunch-12:00 1/2 order medium french fries Coffee (1 1/2 cup)- Equal/splenda Creamers- 3  2:001/2 lite english muffin- 1 tb spoon of peanut butter, raspberry jam  4:00 - peach  8:00pm Dinner- vegetable soup- (3 cups) Whole wheat rigatoni pasta- 1 cup 1/4 cup of italian cheese  This wasn't typical-- Usually doesn't eat french fries.  Often skips lunch- eats extra piece of fruit or nuts  Exercise: 30 walking on treadmill-- 1.5 mile Daily--usually in the evening around 5pm 1-2 x per week- goes to gym with trainer.   Consistent foods: Fiber one, pasta, italian cheese, coffee, icetea (unsweet),    1 vegetable per day typically.  --     O: see vitals and above 24 hour recall Total Kcal for yesterday 24 hour recall= 1600-1700   A and P: 66 y.o. Male with pmh of CHF, CAD, and Hyperlipidemia here for dietary management of cardiac risk factors:  Recommendations: Increase vegetables: -increase vegetables to at least 2 x per day.  -add to dinner--brocolli.  -add to lunch-carrots, celery, tomatoes, etc -make a list of all of the vegetables that you currently use and like -make a list of vegetables that you would like to try.  Don't skip lunch!  Make sure you eat enough- without increasing your calories.   It is important to eat 3  meals per day. Plus, 1-2 snacks per day.    Goals is to eat 5-9 fruits and vegetables per day!  ;)  Switch to the natural peanut butter.   Continue to remain active!!!!  Please schedule in Nutrition Clinic for March 28th.  Spent 1 hour with patient.  Over 1/2 of time spent counseling patient on above recommendations.

## 2011-06-14 NOTE — Assessment & Plan Note (Signed)
See above SOAP note

## 2011-06-21 ENCOUNTER — Encounter: Payer: Self-pay | Admitting: Internal Medicine

## 2011-06-21 ENCOUNTER — Ambulatory Visit (INDEPENDENT_AMBULATORY_CARE_PROVIDER_SITE_OTHER): Payer: PRIVATE HEALTH INSURANCE | Admitting: *Deleted

## 2011-06-21 DIAGNOSIS — I428 Other cardiomyopathies: Secondary | ICD-10-CM

## 2011-06-22 LAB — REMOTE ICD DEVICE
BRDY-0002RV: 40 {beats}/min
CHARGE TIME: 12 s
FVT: 0
HV IMPEDENCE: 41 Ohm
RV LEAD IMPEDENCE ICD: 629 Ohm
TOT-0006: 20121206000000
TZAT-0001FASTVT: 2
TZAT-0001SLOWVT: 1
TZAT-0001SLOWVT: 2
TZAT-0013FASTVT: 2
TZAT-0013SLOWVT: 2
TZAT-0018FASTVT: NEGATIVE
TZAT-0018SLOWVT: NEGATIVE
TZON-0003FASTVT: 300 ms
TZON-0003SLOWVT: 352.9 ms
TZST-0001FASTVT: 5
TZST-0001FASTVT: 7
TZST-0001SLOWVT: 5
TZST-0001SLOWVT: 6
TZST-0003FASTVT: 26 J
TZST-0003FASTVT: 31 J
TZST-0003FASTVT: 31 J
TZST-0003SLOWVT: 17 J
TZST-0003SLOWVT: 26 J
TZST-0003SLOWVT: 31 J
VF: 0

## 2011-07-02 ENCOUNTER — Encounter: Payer: Self-pay | Admitting: Internal Medicine

## 2011-07-04 ENCOUNTER — Encounter: Payer: Self-pay | Admitting: *Deleted

## 2011-07-05 ENCOUNTER — Ambulatory Visit (INDEPENDENT_AMBULATORY_CARE_PROVIDER_SITE_OTHER): Payer: PRIVATE HEALTH INSURANCE | Admitting: Family Medicine

## 2011-07-05 DIAGNOSIS — I2510 Atherosclerotic heart disease of native coronary artery without angina pectoris: Secondary | ICD-10-CM

## 2011-07-05 NOTE — Progress Notes (Signed)
Remote icd check  

## 2011-07-05 NOTE — Patient Instructions (Signed)
Consider reading: "Intuitive eating"-- Roberto Scales  3 questions to ask to make a good meal decision: How hungry am I? What am I in the mood for? What is good for me?  Remember to ask: Does my meal look like, feel like, taste like a meal.   You deserve to eat 3 meals per day.   Remember to add protein to each meal.   You need a least 55grams of protein.   Measure olive oil when used.   Return in 1 month for follow up-- schedule in nutrition clinic with Dr. Gwendolyn Grant.

## 2011-07-05 NOTE — Assessment & Plan Note (Addendum)
Pt's efforts to restrict calories are probably counterproductive to his ultimate goal of weightloss and optimal health-- most likely causing lowering RMR 2/2 to restriction.   Pt has made the following nutritional goals:  Consider reading: "Intuitive eating"-- Jake Carter  3 questions to ask to make a good meal decision: How hungry am I? What am I in the mood for? What is good for me?  Remember to ask: Does my meal look like, feel like, taste like a meal.   You deserve to eat 3 meals per day.   Remember to add protein to each meal.   You need a least 55grams of protein.   Measure olive oil when used.   Return in 1 month for follow up-- schedule in nutrition clinic with Dr. Gwendolyn Grant  Greater than 50% of face time spent in counseling patient.

## 2011-07-05 NOTE — Progress Notes (Signed)
Subjective:     Patient ID: Jake Carter, male   DOB: 05/06/1945, 66 y.o.   MRN: [id number]  HPI Pt states that he has increased his vegetable and fruit intake- but 24 hour recall shows that these have not increased as much as he had thought.  Yesterday's intake included 2 vegetables and 3 fruits.  And still no actual lunch meal.  Pt continues to exercise as described in last note.   Diet history suggests that his personal trainer is correct in that he does not get adequate protein in his daily diet.  Pt expresses frustration and ongoing anxiety about food and weight.   Review of Systems     Objective:   Physical Exam  Constitutional: He appears well-developed and well-nourished.  HENT:  Head: Atraumatic.  Cardiovascular: Normal rate.   Pulmonary/Chest: Effort normal. No respiratory distress.  Neurological: He is alert.       Assessment:         Plan:     See problem list for assessment and plan

## 2011-07-09 ENCOUNTER — Telehealth: Payer: Self-pay | Admitting: Oncology

## 2011-07-09 NOTE — Telephone Encounter (Signed)
S/w pt re appt for 4/30.  °

## 2011-08-02 ENCOUNTER — Ambulatory Visit: Payer: PRIVATE HEALTH INSURANCE | Admitting: Family Medicine

## 2011-08-06 ENCOUNTER — Other Ambulatory Visit: Payer: Self-pay | Admitting: *Deleted

## 2011-08-06 DIAGNOSIS — C182 Malignant neoplasm of ascending colon: Secondary | ICD-10-CM

## 2011-08-07 ENCOUNTER — Ambulatory Visit (HOSPITAL_BASED_OUTPATIENT_CLINIC_OR_DEPARTMENT_OTHER): Payer: PRIVATE HEALTH INSURANCE | Admitting: Oncology

## 2011-08-07 ENCOUNTER — Telehealth: Payer: Self-pay | Admitting: Oncology

## 2011-08-07 ENCOUNTER — Inpatient Hospital Stay (HOSPITAL_COMMUNITY): Admit: 2011-08-07 | Payer: Self-pay

## 2011-08-07 ENCOUNTER — Other Ambulatory Visit (HOSPITAL_COMMUNITY)
Admission: RE | Admit: 2011-08-07 | Discharge: 2011-08-07 | Disposition: A | Discharge status: Home / Self Care | Payer: PRIVATE HEALTH INSURANCE | Source: Ambulatory Visit | Attending: Oncology | Admitting: Oncology

## 2011-08-07 ENCOUNTER — Other Ambulatory Visit (HOSPITAL_BASED_OUTPATIENT_CLINIC_OR_DEPARTMENT_OTHER): Payer: PRIVATE HEALTH INSURANCE | Admitting: Lab

## 2011-08-07 VITALS — BP 98/62 | HR 57 | Temp 96.7°F | Ht 67.0 in | Wt 167.8 lb

## 2011-08-07 DIAGNOSIS — Z85038 Personal history of other malignant neoplasm of large intestine: Secondary | ICD-10-CM

## 2011-08-07 DIAGNOSIS — R319 Hematuria, unspecified: Secondary | ICD-10-CM | POA: Insufficient documentation

## 2011-08-07 DIAGNOSIS — C182 Malignant neoplasm of ascending colon: Secondary | ICD-10-CM

## 2011-08-07 DIAGNOSIS — R599 Enlarged lymph nodes, unspecified: Secondary | ICD-10-CM

## 2011-08-07 LAB — PSA: PSA: 3.19 ng/mL (ref <=4.00)

## 2011-08-07 NOTE — Progress Notes (Signed)
OFFICE PROGRESS NOTE   INTERVAL HISTORY:   He returns as scheduled. He feels well. He reports constipation when he takes Effexor. He is followed closely by Dr. Matthias Hughs. He reports being diagnosed with a gastric ulcers. He has been treated with antacid therapy and has undergone followup endoscopy procedures by Dr.Buccini.  Objective:  Vital signs in last 24 hours:  Blood pressure 98/62, pulse 57, temperature 96.7 F (35.9 C), temperature source Oral, height 5\' 7"  (1.702 m), weight 167 lb 12.8 oz (76.114 kg).    HEENT: Neck without mass Lymphatics: Pea-sized left posterior cervical node, no other cervical, supraclavicular, axillar, or inguinal nodes Resp: Lungs clear bilaterally Cardio: Regular rate and rhythm GI: No hepatosplenomegaly, no mass Vascular: No leg edema      Lab Results:  CEA and urine cytology pending   Medications: I have reviewed the patient's current medications.  Assessment/Plan: 1.         Stage II colon cancer diagnosed in April 2006.  He remains in clinical remission. 2. Hereditary nonpolyposis colon cancer syndrome - he continues yearly surveillance endoscopy and colonoscopy procedures by Dr Matthias Hughs. Last colonoscopy was in July of 2012. 3. Stable small left posterior cervical lymph node. 4. History of coronary artery disease, status post placement of a defibrillator. 5. 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   6. Discomfort in the left inguinal region -chronic, ? related to interstitial cystitis.  He is followed by Dr.Dahlstedt. 7.         Gastric ulcers diagnosed in July 2012, biopsy benign, now maintained on Prevacid and followed by Dr.Buccini  Disposition:  He remains in clinical remission from colon cancer. He will continue endoscopy followup with Dr.Buccini. He would like to continue followup at cancer  Center. He will return for an office visit in one year.   Jake Papas, MD  08/07/2011  1:34 PM

## 2011-08-07 NOTE — Telephone Encounter (Signed)
Gave pt appt calendar for April 2014 lab and MD °

## 2011-08-10 LAB — CYTOLOGY, URINE

## 2011-08-14 ENCOUNTER — Ambulatory Visit (INDEPENDENT_AMBULATORY_CARE_PROVIDER_SITE_OTHER): Payer: PRIVATE HEALTH INSURANCE | Admitting: Urology

## 2011-08-14 DIAGNOSIS — N529 Male erectile dysfunction, unspecified: Secondary | ICD-10-CM

## 2011-08-14 DIAGNOSIS — R1032 Left lower quadrant pain: Secondary | ICD-10-CM

## 2011-08-14 DIAGNOSIS — N400 Enlarged prostate without lower urinary tract symptoms: Secondary | ICD-10-CM

## 2011-08-15 ENCOUNTER — Telehealth: Payer: Self-pay | Admitting: *Deleted

## 2011-08-15 NOTE — Telephone Encounter (Signed)
Message copied by Caleb Popp on Wed Aug 15, 2011 12:53 PM ------      Message from: Thornton Papas B      Created: Wed Aug 08, 2011  9:15 PM       Please call patient, cea normal and urine cytology is negative.            Copy PSA to Urology

## 2011-08-15 NOTE — Telephone Encounter (Signed)
Left message on voicemail for pt to call office.  

## 2011-08-15 NOTE — Telephone Encounter (Signed)
Pt returned call, requested I leave message on voicemail if unable to reach him. Same done. PSA electronically faxed to Dr. Retta Diones.

## 2011-09-06 ENCOUNTER — Encounter: Payer: PRIVATE HEALTH INSURANCE | Admitting: Internal Medicine

## 2011-09-14 ENCOUNTER — Ambulatory Visit (INDEPENDENT_AMBULATORY_CARE_PROVIDER_SITE_OTHER): Payer: PRIVATE HEALTH INSURANCE | Admitting: Internal Medicine

## 2011-09-14 ENCOUNTER — Encounter: Payer: Self-pay | Admitting: Internal Medicine

## 2011-09-14 VITALS — BP 110/68 | HR 68 | Ht 67.0 in | Wt 169.8 lb

## 2011-09-14 DIAGNOSIS — I509 Heart failure, unspecified: Secondary | ICD-10-CM

## 2011-09-14 DIAGNOSIS — Z95810 Presence of automatic (implantable) cardiac defibrillator: Secondary | ICD-10-CM

## 2011-09-14 DIAGNOSIS — I2510 Atherosclerotic heart disease of native coronary artery without angina pectoris: Secondary | ICD-10-CM

## 2011-09-14 LAB — ICD DEVICE OBSERVATION
BRDY-0002RV: 40 {beats}/min
RV LEAD AMPLITUDE: 11.3 mv
RV LEAD IMPEDENCE ICD: 629 Ohm
RV LEAD THRESHOLD: 1 V
TZAT-0001SLOWVT: 2
TZAT-0013FASTVT: 2
TZAT-0013SLOWVT: 2
TZAT-0013SLOWVT: 2
TZAT-0018FASTVT: NEGATIVE
TZAT-0018SLOWVT: NEGATIVE
TZON-0003FASTVT: 300 ms
TZST-0001FASTVT: 3
TZST-0001FASTVT: 6
TZST-0001FASTVT: 7
TZST-0001SLOWVT: 6
TZST-0001SLOWVT: 7
TZST-0003FASTVT: 31 J
TZST-0003FASTVT: 31 J
TZST-0003SLOWVT: 17 J
TZST-0003SLOWVT: 26 J
VENTRICULAR PACING ICD: 1 pct

## 2011-09-14 NOTE — Assessment & Plan Note (Signed)
He denies anginal symptoms. No change in medical therapy. 

## 2011-09-14 NOTE — Assessment & Plan Note (Signed)
His device is working normally. We'll plan to recheck in several months. 

## 2011-09-14 NOTE — Patient Instructions (Addendum)
The current medical regimen is effective;  continue present plan and medications.  Follow up in 1 year with Dr Taylor.  You will receive a letter in the mail 2 months before you are due.  Please call us when you receive this letter to schedule your follow up appointment.  

## 2011-09-14 NOTE — Progress Notes (Signed)
HPI [name] returns today for followup. He is a very pleasant 66 year old man with an ischemic cardiomyopathy, chronic class II congestive heart failure, dyslipidemia, status post prophylactic ICD implantation. The patient has done well in the interim. He denies chest pain or shortness of breath. No peripheral edema. He has questions today about possible use of Viagra. Allergies  Allergen Reactions  . Promethazine Hcl     Possible hallucinations     Current Outpatient Prescriptions  Medication Sig Dispense Refill  . ALPRAZolam (XANAX) 0.25 MG tablet Take 0.25 mg by mouth as needed. For anxiety      . aspirin 81 MG EC tablet Take 81 mg by mouth daily. 1 tabs by mouth daily      . atorvastatin (LIPITOR) 20 MG tablet Take 20 mg by mouth daily.        . clopidogrel (PLAVIX) 75 MG tablet Take 75 mg by mouth daily.        . lansoprazole (PREVACID) 30 MG capsule Take 30 mg by mouth daily.        . metoprolol tartrate (LOPRESSOR) 25 MG tablet Take 1/2 tablet once daily.       . Misc Natural Products (GNP RESVERATROL RED WINE EXT PO) Take 1 capsule by mouth daily.      . Multiple Vitamins-Minerals (CENTRUM SILVER PO) Take 1 tablet by mouth daily.      Marland Kitchen NITROSTAT 0.4 MG SL tablet PLACE 1 TABLET UNDER TONGUE EVERY 5     MINUTES FOR 3 DOSES AS NEEDED.  25 tablet  12  . Omega-3 Fatty Acids (FISH OIL) 1200 MG CAPS Take 1 capsule by mouth daily.         Past Medical History  Diagnosis Date  . Cardiomyopathy   . CAD (coronary artery disease)   . Dyslipidemia   . 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 ICD (implantable cardiac defibrillator) in place     ICD - Guidant  . Depression   . Cancer     ROS:   All systems reviewed and negative except as noted in the HPI.   Past Surgical History  Procedure Date  . Cardiac defibrillator placement 5/07    Guidant ICD placement  . Cardiac defibrillator placement     [employer] - Remote - yes  . Abdominal  surgery      Family History  Problem Relation Age of Onset  . Cancer Father     died at 97  . Cancer Mother     died at 97  . Cancer Brother     died at 51  . Cancer Sister     died at 10     History   Social History  . Marital Status: Married    Spouse Name: N/A    Number of Children: N/A  . Years of Education: N/A   Occupational History  . Not on file.   Social History Main Topics  . Smoking status: Former Games developer  . Smokeless tobacco: Not on file   Comment: former 23  . Alcohol Use: Yes  . Drug Use: No  . Sexually Active: Not on file   Other Topics Concern  . Not on file   Social History Narrative  . No narrative on file     BP 110/68  Pulse 68  Ht 5\' 7"  (1.702 m)  Wt 169 lb 12.8 oz (77.021 kg)  BMI 26.59 kg/m2  Physical Exam:  Well appearing  middle-aged man, NAD HEENT: Unremarkable Neck:  No JVD, no thyromegally Lungs:  Clear with no wheezes, rales, or rhonchi. Well-healed ICD incision. HEART:  Regular rate rhythm, no murmurs, no rubs, no clicks Abd:  soft, positive bowel sounds, no organomegally, no rebound, no guarding Ext:  2 plus pulses, no edema, no cyanosis, no clubbing Skin:  No rashes no nodules Neuro:  CN II through XII intact, motor grossly intact  DEVICE  Normal device function.  See PaceArt for details.   Assess/Plan:

## 2011-09-14 NOTE — Assessment & Plan Note (Signed)
His chronic systolic heart failure is well compensated, class II. He'll continue his current medical therapy.

## 2011-09-24 ENCOUNTER — Ambulatory Visit: Payer: PRIVATE HEALTH INSURANCE | Admitting: Internal Medicine

## 2011-10-05 ENCOUNTER — Ambulatory Visit (INDEPENDENT_AMBULATORY_CARE_PROVIDER_SITE_OTHER): Payer: PRIVATE HEALTH INSURANCE | Admitting: Psychology

## 2011-10-05 DIAGNOSIS — F4322 Adjustment disorder with anxiety: Secondary | ICD-10-CM

## 2011-10-30 ENCOUNTER — Ambulatory Visit (INDEPENDENT_AMBULATORY_CARE_PROVIDER_SITE_OTHER): Payer: No Typology Code available for payment source | Admitting: Psychology

## 2011-10-30 ENCOUNTER — Encounter (HOSPITAL_COMMUNITY): Payer: Self-pay | Admitting: Psychology

## 2011-10-30 DIAGNOSIS — F4322 Adjustment disorder with anxiety: Secondary | ICD-10-CM

## 2011-10-30 NOTE — Progress Notes (Signed)
Patient:   Jake Carter   DOB:   [birth year]  MR Number:  [id number]  Location:  [facility]HAV[facility] [address] [place] [address] Dept: [phone]           Date of Service:   10/05/2011  Start Time:   2 PM End Time:   3 PM  Provider/Observer:  Hershal Coria PSYD       Billing Code/Service: [phone]  Chief Complaint:     Chief Complaint  Patient presents with  . Stress    Reason for Service:  The patient was referred by Dr. Stephania Fragmin do to increasing symptoms of stress particularly associated with conflict with his son. The patient reports that he has had some medical issues over the past 6 or 7 years. Initially he had a heart attack 6 or 7 years ago and then was diagnosed with stage II colon cancer. This was addressed and he has been treated successfully. The patient was then placed on a defibrillator and has been doing well since. However, there is a major stressor now have to do with his 50 year old son. The patient essentially reports that he and his son have been "knocking heads." The patient has an older son who has been diagnosed with bipolar but is doing well and is well treated. The patient reports that over the last couple of years after his heart attack that there've been increasing conflict between the patient and his son. Overall, his son is viewed by others as a very polite kids but can be very aggressive verbally towards the patient and is not responsible in his life. His son will not get up out of bed in the morning and be responsible and is constantly smoking marijuana.  Current Status:  The patient is a 66 year old Caucasian male who has been treated for a heart attack 6 or 7 years ago and in stage II colon cancer. He has a defibrillator. He is doing well medically but has a major stress right now with his son and this is causing a great deal of anxiety and worry.  Reliability of  Information: The information was provided by the patient himself  Behavioral Observation: Jake Carter  presents as a 66 y.o.-year-old Right Caucasian Male who appeared his stated age. his dress was Appropriate and he was Well Groomed and his manners were Appropriate to the situation.  There were not any physical disabilities noted.  he displayed an appropriate level of cooperation and motivation.    Interactions:    Active   Attention:   within normal limits  Memory:   within normal limits  Visuo-spatial:   within normal limits  Speech (Volume):  normal  Speech:   normal pitch and normal volume  Thought Process:  Coherent  Though Content:  WNL  Orientation:   person, place, time/date and situation  Judgment:   Good  Planning:   Good  Affect:    Anxious  Mood:    Anxious  Insight:   Good  Intelligence:   very high  Marital Status/Living: The patient was born and raised in [place] and had both a brother and sister who are both deceased. Both his parents are deceased as well. The patient is been married 3 times and is currently married to his third wife. He is a 80 year old daughter as well as 2 older sons age 29 and 61 as well as a 51 year old son. He currently lives with  his third wife and youngest son.  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 He is not having any problems with his current job. His wife also works. They're not under significant financial stress.  Substance Use:  No concerns of substance abuse are reported.    Education:   College  Medical History:   Past Medical History  Diagnosis Date  . Cardiomyopathy   . CAD (coronary artery disease)   . Dyslipidemia   . 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 ICD (implantable cardiac defibrillator) in place     ICD - Guidant  . Depression   . Cancer         Outpatient Encounter Prescriptions as of 10/05/2011    Medication Sig Dispense Refill  . ALPRAZolam (XANAX) 0.25 MG tablet Take 0.25 mg by mouth as needed. For anxiety      . aspirin 81 MG EC tablet Take 81 mg by mouth daily. 1 tabs by mouth daily      . atorvastatin (LIPITOR) 20 MG tablet Take 20 mg by mouth daily.        . clopidogrel (PLAVIX) 75 MG tablet Take 75 mg by mouth daily.        . lansoprazole (PREVACID) 30 MG capsule Take 30 mg by mouth daily.        . metoprolol tartrate (LOPRESSOR) 25 MG tablet Take 1/2 tablet once daily.       . Misc Natural Products (GNP RESVERATROL RED WINE EXT PO) Take 1 capsule by mouth daily.      . Multiple Vitamins-Minerals (CENTRUM SILVER PO) Take 1 tablet by mouth daily.      Marland Kitchen NITROSTAT 0.4 MG SL tablet PLACE 1 TABLET UNDER TONGUE EVERY 5     MINUTES FOR 3 DOSES AS NEEDED.  25 tablet  12  . Omega-3 Fatty Acids (FISH OIL) 1200 MG CAPS Take 1 capsule by mouth daily.              Sexual History:   History  Sexual Activity  . Sexually Active: Not on file    Abuse/Trauma History:  the patient denies any history of trauma or abuse   Psychiatric History:   the patient some counseling for adjustment difficulties back in 1984.  Family Med/Psych History:  Family History  Problem Relation Age of Onset  . Cancer Father     died at 80  . Cancer Mother     died at 2  . Cancer Brother     died at 49  . Cancer Sister     died at 72    Risk of Suicide/Violence: virtually non-existent   Impression/DX:   at this point, the patient does describe difficulties in conflict between he and his shunt his son. While his older 2 sons of adjusting to her father's medical issues his youngest son has had difficulty and it really culminated in started after the patient has a heart attack. He has been had treated colon cancer and has had a defibrillator placed. It sounds like his son is rebound in some and having difficulty adjusting to his father's mortality and current health issues.   Disposition/Plan:   we will  set up the patient for individual psychotherapy at this point, it does not appear to be in need for psychiatry.  Diagnosis:    Axis I:   1. Adjustment disorder with anxiety  Axis II: No diagnosis       Axis III:   history of heart attack, colon cancer, and defibrillator      Axis IV:  other psychosocial or environmental problems          Axis V:  51-60 moderate symptoms

## 2011-11-01 ENCOUNTER — Encounter (HOSPITAL_COMMUNITY): Payer: Self-pay | Admitting: Psychology

## 2011-11-01 NOTE — Progress Notes (Signed)
Patient:  Jake Carter   DOB: 1946/01/11  MR Number: [id number]  Location: BEHAVIORAL [facility] [address] [place] [address] Dept: [phone]  Start: 11 AM End: 12 PM  Provider/Observer:     Hershal Coria PSYD  Chief Complaint:      Chief Complaint  Patient presents with  . Stress  . Depression    Reason For Service:   The patient was referred by Dr. Stephania Fragmin do to increasing symptoms of stress particularly associated with conflict with his son. The patient reports that he has had some medical issues over the past 6 or 7 years. Initially he had a heart attack 6 or 7 years ago and then was diagnosed with stage II colon cancer. This was addressed and he has been treated successfully. The patient was then placed on a defibrillator and has been doing well since. However, there is a major stressor now have to do with his 15 year old son. The patient essentially reports that he and his son have been "knocking heads." The patient has an older son who has been diagnosed with bipolar but is doing well and is well treated. The patient reports that over the last couple of years after his heart attack that there've been increasing conflict between the patient and his son. Overall, his son is viewed by others as a very polite kids but can be very aggressive verbally towards the patient and is not responsible in his life. His son will not get up out of bed in the morning and be responsible and is constantly smoking marijuana.   Interventions Strategy:  Cognitive/behavioral psychotherapeutic interventions  Participation Level:   Active  Participation Quality:  Appropriate      Behavioral Observation:  Well Groomed, Alert, and Appropriate.   Current Psychosocial Factors: The patient reports that there continue to be similar struggles with his son but he has been working on how to cope with this. He described her  particular situation where he became very upset and his son again and his wife essentially made him feel like he was his fault. The son but promised to go golfing with the patient and then backed out at the last minute and then agreed to come have dinner with them and would go bye-bye charcoal so they can cookout. His son essentially stopped communicating with the patient around 6 PM and then did not show up until after a p.m. The patient became very upset and threatened to leave intact the back after his conflict extended between the patient and his wife. However, after driving around a few hours she came back home.  Content of Session:   Review current symptoms and continued work on therapeutic interventions to build better coping skills and strategies.  Current Status:   The patient reports that he has had increasing frustration and some episodes of symptoms consistent with depression recently.  Patient Progress:   Stable  Last Reviewed:   10/30/2011  Goals Addressed Today:    Goals addressed today have to do with increasing the patient's coping skills and strategies around the conflict between the patient and his son.  Impression/Diagnosis:   at this point, the patient does describe difficulties in conflict between he and his shunt his son. While his older 2 sons of adjusting to her father's medical issues his youngest son has had difficulty and it really culminated in started after the patient has a heart attack. He has been had treated  colon cancer and has had a defibrillator placed. It sounds like his son is rebound in some and having difficulty adjusting to his father's mortality and current health issues.   Diagnosis:    Axis I:  1. Adjustment disorder with anxiety         Axis II: No diagnosis

## 2011-11-22 ENCOUNTER — Encounter: Payer: Self-pay | Admitting: Internal Medicine

## 2011-11-22 ENCOUNTER — Ambulatory Visit (INDEPENDENT_AMBULATORY_CARE_PROVIDER_SITE_OTHER): Payer: No Typology Code available for payment source | Admitting: Internal Medicine

## 2011-11-22 VITALS — BP 109/73 | HR 59 | Ht 67.0 in | Wt 164.0 lb

## 2011-11-22 DIAGNOSIS — I2510 Atherosclerotic heart disease of native coronary artery without angina pectoris: Secondary | ICD-10-CM | POA: Diagnosis not present

## 2011-11-22 DIAGNOSIS — Z95810 Presence of automatic (implantable) cardiac defibrillator: Secondary | ICD-10-CM

## 2011-11-22 NOTE — Patient Instructions (Signed)
Your physician wants you to follow-up in:  6 months. You will receive a reminder letter in the mail two months in advance. If you don't receive a letter, please call our office to schedule the follow-up appointment.   

## 2011-11-22 NOTE — Progress Notes (Signed)
HPI HPIof CAD (status post MI with stent to the LAD in 2005), perfusion study in 2007  which showed an apical infarction, EF of 33. He underwent repeat  angiography that showed a 25% lesion before the stent and patent stent.  30% diagonal, otherwise, no significant disease. RCA was non-dominant.  EF was 25-30%  He was hospitalized last December with CP. Rx medically.  I saw him in clinic in December.   Allergies  Allergen Reactions  . Promethazine Hcl     Possible hallucinations    Current Outpatient Prescriptions  Medication Sig Dispense Refill  . ALPRAZolam (XANAX) 0.25 MG tablet Take 0.25 mg by mouth as needed. For anxiety      . aspirin 81 MG EC tablet Take 81 mg by mouth daily. 1 tabs by mouth daily      . atorvastatin (LIPITOR) 20 MG tablet Take 20 mg by mouth daily.        . clopidogrel (PLAVIX) 75 MG tablet Take 75 mg by mouth daily.        . lansoprazole (PREVACID) 30 MG capsule Take 30 mg by mouth daily.        . metoprolol tartrate (LOPRESSOR) 25 MG tablet Take 1/2 tablet once daily.       . Misc Natural Products (GNP RESVERATROL RED WINE EXT PO) Take 1 capsule by mouth daily.      . Multiple Vitamins-Minerals (CENTRUM SILVER PO) Take 1 tablet by mouth daily.      Marland Kitchen NITROSTAT 0.4 MG SL tablet PLACE 1 TABLET UNDER TONGUE EVERY 5     MINUTES FOR 3 DOSES AS NEEDED.  25 tablet  12  . Omega-3 Fatty Acids (FISH OIL) 1200 MG CAPS Take 1 capsule by mouth daily.        Past Medical History  Diagnosis Date  . Cardiomyopathy   . CAD (coronary artery disease)   . Dyslipidemia   . 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 ICD (implantable cardiac defibrillator) in place     ICD - Guidant  . Depression   . Cancer     Past Surgical History  Procedure Date  . Cardiac defibrillator placement 5/07    Guidant ICD placement  . Cardiac defibrillator placement     [employer] - Remote - yes  . Abdominal surgery     Family History  Problem Relation  Age of Onset  . Cancer Father     died at 58  . Cancer Mother     died at 77  . Cancer Brother     died at 23  . Cancer Sister     died at 17    History   Social History  . Marital Status: Married    Spouse Name: N/A    Number of Children: N/A  . Years of Education: N/A   Occupational History  . Not on file.   Social History Main Topics  . Smoking status: Former Games developer  . Smokeless tobacco: Not on file   Comment: former 84  . Alcohol Use: Yes  . Drug Use: No  . Sexually Active: Not on file   Other Topics Concern  . Not on file   Social History Narrative  . No narrative on file    Review of Systems:  All systems reviewed.  They are negative to the above problem except as previously stated.  Vital Signs: BP 109/73  Pulse 59  Ht 5\' 7"  (1.702  m)  Wt 164 lb (74.39 kg)  BMI 25.69 kg/m2  Physical Exam Patient is in NAD HEENT:  Normocephalic, atraumatic. EOMI, PERRLA.  Neck: JVP is normal.  No bruits.  Lungs: clear to auscultation. No rales no wheezes.  Heart: Regular rate and rhythm. Normal S1, S2. No S3.   No significant murmurs. PMI not displaced.  Abdomen:  Supple, nontender. Normal bowel sounds. No masses. No hepatomegaly.  Extremities:   Good distal pulses throughout. No lower extremity edema.  Musculoskeletal :moving all extremities.  Neuro:   alert and oriented x3.  CN II-XII grossly intact.  EKG;  SB  59 bpm.    Anterior MI  Nonspecific ST T wave changes. Assessment and Plan:  1.  CAD.  No symptoms to sugg angina.  Has been on Plavix.  Will review. 2.  CM.  Volume status continues to be good.  He is extremely acitve.   Class I  To II.   He has not tolerated ACE I in past even at very low dose.  Gets dizzy.  Feels tired.   Will review.  No Change for now.  3.  HL  Cont on lipitor  Good control.  F/U in 6 months.

## 2011-12-20 ENCOUNTER — Ambulatory Visit (INDEPENDENT_AMBULATORY_CARE_PROVIDER_SITE_OTHER): Payer: Medicare Other | Admitting: *Deleted

## 2011-12-20 DIAGNOSIS — Z95810 Presence of automatic (implantable) cardiac defibrillator: Secondary | ICD-10-CM

## 2011-12-23 ENCOUNTER — Other Ambulatory Visit: Payer: Self-pay | Admitting: Internal Medicine

## 2011-12-25 ENCOUNTER — Telehealth: Payer: Self-pay | Admitting: Internal Medicine

## 2011-12-25 NOTE — Telephone Encounter (Signed)
Called patient and he states that he has pain in his left hand not reallly associated with movement. Is able to exercise without any problems. He is concerned because prior to his MI he had pain in the arms and hands ( never had any chest pain or SOB). Offered an appointment with the PA tomorrow but he wants to wait to see Dr.Ross on Friday. He will come in at 11 am on 9/20 to see Dr.Ross and will call back if symptoms change.

## 2011-12-25 NOTE — Telephone Encounter (Signed)
Pt calling re hands hurting again, pls call

## 2011-12-28 ENCOUNTER — Ambulatory Visit (INDEPENDENT_AMBULATORY_CARE_PROVIDER_SITE_OTHER): Payer: Medicare Other | Admitting: Internal Medicine

## 2011-12-28 ENCOUNTER — Encounter: Payer: Self-pay | Admitting: Internal Medicine

## 2011-12-28 VITALS — BP 99/62 | HR 63 | Ht 67.0 in | Wt 161.0 lb

## 2011-12-28 DIAGNOSIS — I2510 Atherosclerotic heart disease of native coronary artery without angina pectoris: Secondary | ICD-10-CM

## 2011-12-28 DIAGNOSIS — M542 Cervicalgia: Secondary | ICD-10-CM | POA: Diagnosis not present

## 2011-12-28 NOTE — Patient Instructions (Addendum)
Your physician recommends you have cervical neck xrays at [facility]

## 2011-12-28 NOTE — Progress Notes (Signed)
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 He underwent repeat  angiography that showed a 25% lesion before the stent and patent stent.  30% diagonal, otherwise, no significant disease. RCA was non-dominant.  EF was 25-30%  He was hospitalized last December with CP. Rx medically.  I saw him in clinic  AUgust15..   Allergies  Allergen Reactions  . Promethazine Hcl     Possible hallucinations    Current Outpatient Prescriptions  Medication Sig Dispense Refill  . ALPRAZolam (XANAX) 0.25 MG tablet Take 0.25 mg by mouth as needed. For anxiety      . AMITRIPTYLINE HCL PO Take by mouth. Pt taking the lowest dose per uroligist      . aspirin 81 MG EC tablet Take 81 mg by mouth daily. 1 tabs by mouth daily      . atorvastatin (LIPITOR) 20 MG tablet Take 20 mg by mouth daily.        . clopidogrel (PLAVIX) 75 MG tablet Take 75 mg by mouth daily.        . lansoprazole (PREVACID) 30 MG capsule Take 30 mg by mouth daily.        . metoprolol tartrate (LOPRESSOR) 25 MG tablet Take 1/2 tablet once daily.       . Misc Natural Products (GNP RESVERATROL RED WINE EXT PO) Take 1 capsule by mouth daily.      . Multiple Vitamins-Minerals (CENTRUM SILVER PO) Take 1 tablet by mouth daily.      Marland Kitchen NITROSTAT 0.4 MG SL tablet PLACE 1 TABLET UNDER TONGUE EVERY 5     MINUTES FOR 3 DOSES AS NEEDED.  25 tablet  12  . Omega-3 Fatty Acids (FISH OIL) 1200 MG CAPS Take 1 capsule by mouth daily.        Past Medical History  Diagnosis Date  . Cardiomyopathy   . CAD (coronary artery disease)   . Dyslipidemia   . 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 ICD (implantable cardiac defibrillator) in place     ICD - Guidant  . Depression   . Cancer     Past Surgical History  Procedure Date  . Cardiac defibrillator placement 5/07    Guidant ICD placement  . Cardiac  defibrillator placement     [employer] - Remote - yes  . Abdominal surgery     Family History  Problem Relation Age of Onset  . Cancer Father     died at 93  . Cancer Mother     died at 18  . Cancer Brother     died at 68  . Cancer Sister     died at 59    History   Social History  . Marital Status: Married    Spouse Name: N/A    Number of Children: N/A  . Years of Education: N/A   Occupational History  . Not on file.   Social History Main Topics  . Smoking status: Former Games developer  . Smokeless tobacco: Not on file   Comment: former 18  . Alcohol Use: Yes  . Drug Use: No  . Sexually Active: Not on file   Other Topics Concern  . Not on file   Social History Narrative  . No narrative on file    Review of Systems:  All systems reviewed.  They are negative to the above problem except as previously stated.  Vital Signs: BP 99/62  Pulse 63  Ht 5\' 7"  (1.702 m)  Wt 161 lb (73.029 kg)  BMI 25.22 kg/m2  Physical Exam Patient is in NAD HEENT:  Normocephalic, atraumatic. EOMI, PERRLA.  Neck: JVP is normal.  No bruits.  Lungs: clear to auscultation. No rales no wheezes.  Heart: Regular rate and rhythm. Normal S1, S2. No S3.   No significant murmurs. PMI not displaced.  Abdomen:  Supple, nontender. Normal bowel sounds. No masses. No hepatomegaly.  Extremities:   Good distal pulses throughout. No lower extremity edema.  Musculoskeletal :moving all extremities.  Neuro:   alert and oriented x3.  CN II-XII grossly intact.  EKG:  SR  63 bpm.  Anteriro MI.   Assessment and Plan:  1.  Hand discomfort.  I am not convinced this represents angina.  Not associated with activity.  May be musculoskeletal.  WIll plan Xrays of neck for initial evaluation.  2.  CAD  Continue meds.    3.  CHF  Volume status is good.  Again, attempts to push medical Rx in past accomp by dizziness, fatigue.  Would keep on same regimen  4.  Dyslipidemia.  Would continue current regimen.   Recent labs  Will rereview.

## 2012-01-02 LAB — REMOTE ICD DEVICE
BATTERY VOLTAGE: 2.58 V
BRDY-0002RV: 40 {beats}/min
DEVICE MODEL ICD: 121719
HV IMPEDENCE: 41 Ohm
PACEART VT: 0
TZAT-0001FASTVT: 1
TZAT-0001FASTVT: 2
TZAT-0001SLOWVT: 1
TZAT-0013FASTVT: 2
TZAT-0018FASTVT: NEGATIVE
TZAT-0018SLOWVT: NEGATIVE
TZAT-0018SLOWVT: NEGATIVE
TZST-0001FASTVT: 4
TZST-0001FASTVT: 5
TZST-0001SLOWVT: 3
TZST-0001SLOWVT: 5
TZST-0003FASTVT: 31 J
TZST-0003SLOWVT: 31 J
TZST-0003SLOWVT: 31 J
TZST-0003SLOWVT: 31 J

## 2012-01-04 ENCOUNTER — Ambulatory Visit (HOSPITAL_COMMUNITY)
Admission: RE | Admit: 2012-01-04 | Discharge: 2012-01-04 | Disposition: A | Discharge status: Home / Self Care | Payer: Medicare Other | Source: Ambulatory Visit | Attending: Internal Medicine | Admitting: Internal Medicine

## 2012-01-04 DIAGNOSIS — M5030 Other cervical disc degeneration, unspecified cervical region: Secondary | ICD-10-CM | POA: Diagnosis not present

## 2012-01-04 DIAGNOSIS — M542 Cervicalgia: Secondary | ICD-10-CM

## 2012-01-11 DIAGNOSIS — N486 Induration penis plastica: Secondary | ICD-10-CM | POA: Diagnosis not present

## 2012-01-11 DIAGNOSIS — R3915 Urgency of urination: Secondary | ICD-10-CM | POA: Diagnosis not present

## 2012-01-11 DIAGNOSIS — N529 Male erectile dysfunction, unspecified: Secondary | ICD-10-CM | POA: Diagnosis not present

## 2012-01-11 DIAGNOSIS — R351 Nocturia: Secondary | ICD-10-CM | POA: Diagnosis not present

## 2012-01-11 DIAGNOSIS — R350 Frequency of micturition: Secondary | ICD-10-CM | POA: Diagnosis not present

## 2012-01-14 ENCOUNTER — Telehealth: Payer: Self-pay | Admitting: Internal Medicine

## 2012-01-14 NOTE — Telephone Encounter (Signed)
Left msg on voice mail  1.  Reviewed cath films with T Stuckey  With Costar stent and narrowing of diagonal would recomm continuing plavix.  2.  DJD of neck.  Discussed with Denice Paradise. If symptoms in hand recur can see for evaluation and exercise.

## 2012-01-18 DIAGNOSIS — K319 Disease of stomach and duodenum, unspecified: Secondary | ICD-10-CM | POA: Diagnosis not present

## 2012-01-18 DIAGNOSIS — Z8500 Personal history of malignant neoplasm of unspecified digestive organ: Secondary | ICD-10-CM | POA: Diagnosis not present

## 2012-01-18 DIAGNOSIS — Z09 Encounter for follow-up examination after completed treatment for conditions other than malignant neoplasm: Secondary | ICD-10-CM | POA: Diagnosis not present

## 2012-01-18 DIAGNOSIS — Z85038 Personal history of other malignant neoplasm of large intestine: Secondary | ICD-10-CM | POA: Diagnosis not present

## 2012-01-18 DIAGNOSIS — K5730 Diverticulosis of large intestine without perforation or abscess without bleeding: Secondary | ICD-10-CM | POA: Diagnosis not present

## 2012-01-18 DIAGNOSIS — K649 Unspecified hemorrhoids: Secondary | ICD-10-CM | POA: Diagnosis not present

## 2012-01-18 LAB — HM COLONOSCOPY: HM Colonoscopy: NORMAL

## 2012-01-22 ENCOUNTER — Encounter: Payer: Self-pay | Admitting: Family Medicine

## 2012-01-22 ENCOUNTER — Encounter: Payer: Self-pay | Admitting: Internal Medicine

## 2012-01-22 ENCOUNTER — Ambulatory Visit (INDEPENDENT_AMBULATORY_CARE_PROVIDER_SITE_OTHER): Payer: Medicare Other | Admitting: Family Medicine

## 2012-01-22 VITALS — BP 114/72 | HR 63 | Temp 97.6°F | Ht 67.0 in | Wt 161.0 lb

## 2012-01-22 DIAGNOSIS — N3941 Urge incontinence: Secondary | ICD-10-CM | POA: Diagnosis not present

## 2012-01-22 DIAGNOSIS — L989 Disorder of the skin and subcutaneous tissue, unspecified: Secondary | ICD-10-CM | POA: Diagnosis not present

## 2012-01-22 DIAGNOSIS — I509 Heart failure, unspecified: Secondary | ICD-10-CM

## 2012-01-22 DIAGNOSIS — I2510 Atherosclerotic heart disease of native coronary artery without angina pectoris: Secondary | ICD-10-CM | POA: Diagnosis not present

## 2012-01-22 DIAGNOSIS — N400 Enlarged prostate without lower urinary tract symptoms: Secondary | ICD-10-CM

## 2012-01-22 DIAGNOSIS — Z23 Encounter for immunization: Secondary | ICD-10-CM

## 2012-01-22 DIAGNOSIS — C182 Malignant neoplasm of ascending colon: Secondary | ICD-10-CM

## 2012-01-22 HISTORY — DX: Benign prostatic hyperplasia without lower urinary tract symptoms: N40.0

## 2012-01-22 MED ORDER — OXYBUTYNIN CHLORIDE 5 MG PO TABS: ORAL_TABLET | ORAL | Status: DC

## 2012-01-22 NOTE — Assessment & Plan Note (Signed)
Describes minimal sx's--possibly NYHA class 2 at the worst. Followed closely by cardiology, continue current regimen.

## 2012-01-22 NOTE — Assessment & Plan Note (Signed)
The plan is apparently to keep him on plavix + ASA indefinitely given the location of his prior lesion and the mild residual/nonobstructive disease remaining.  Continue daily lansoprazole 30mg .

## 2012-01-22 NOTE — Assessment & Plan Note (Signed)
Remission. Recent surveillance colonoscopy last week was normal. Continue appropriate GI f/u.

## 2012-01-22 NOTE — Progress Notes (Signed)
Office Note 01/22/2012  CC:  Chief Complaint  Patient presents with  . Establish Care    HPI:  Jake Carter is a 66 y.o. White male who is here to establish care. Patient's most recent primary MD: Dr. Milford Cage and myself while at [facility] in [place], [place]. Old records in EMR were reviewed prior to or during today's visit. Last CPE with labs 07/2011.   He says all labs were excellent.  He has nocturia 3-4 times per night, also some urinary frequency and urgency during the day by his estimation. Extensive urologic w/u (cystoscopy, urine cytology twice in 2013, tests of post-void residual, prostate size eval)--all unrevealing.  In distant past he saw Dr. Earlene Plater and the he said he thought he may have interstitial cystitis.  Pt recalls usually having hemoglobin noted on dipstick urines but microscopy shows none.  Pt has been continuing f/u at [facility] since Dr. Earlene Plater left for [facility] but pt seems dissatisfied with having to see a different provider each time.  He feels like more testing is warranted in order to reassure him. He has been considering getting re-established with Dr. Earlene Plater at [facility].  Has an irritating spot on skin of right pinna, present for months, wants it removed.  Past Medical History  Diagnosis Date  . Cardiomyopathy     EF about 30%  . CAD (coronary artery disease)     apical MI  . Dyslipidemia   . 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 ICD (implantable cardiac defibrillator) in place     ICD - Guidant  . Adjustment disorder with anxiety   . 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 Matthias Hughs)  . History of gastric ulcer 10/2010    Bx benign.  . Interstitial cystitis     Dr. Earlene Plater  . History of diverticulitis of colon   . Urge incontinence 01/22/2012    Nocturia is his biggest issue     Past  Surgical History  Procedure Date  . Cardiac defibrillator placement 5/07    Guidant ICD placement  . Cardiac defibrillator placement     [employer] - Remote - yes  . Abdominal surgery 2007    partial colectomy for colon cancer  . Colonoscopy     Normal 01/18/2012 (Dr. Matthias Hughs)  . Coronary stent placement 2006  . Tonsillectomy 1972    Family History  Problem Relation Age of Onset  . Cancer Father     died at 32 (colon)  . Cancer Mother     died at 2 (breast ca)  . Cancer Brother     died at 26  . Cancer Sister     died at 16    History   Social History  . Marital Status: Married    Spouse Name: N/A    Number of Children: N/A  . Years of Education: N/A   Occupational History  . Not on file.   Social History Main Topics  . Smoking status: Former Smoker    Quit date: 04/10/1979  . Smokeless tobacco: Never Used  . Alcohol Use: Yes     wine on weekend  . Drug Use: No  . Sexually Active: Not on file   Other Topics Concern  . Not on file   Social History Narrative   Married, one son in early 76s.  Lives in [place],  [place].One year of college education but held high level/leadership position with [employer] .Semi-retired/consultant as of around 2010.Works out several days per week (cardio and light weights).  Eats healthy diet.    Outpatient Encounter Prescriptions as of 01/22/2012  Medication Sig Dispense Refill  . ALPRAZolam (XANAX) 0.25 MG tablet Take 0.25 mg by mouth as needed. For anxiety      . aspirin 81 MG EC tablet Take 81 mg by mouth daily. 1 tabs by mouth daily      . atorvastatin (LIPITOR) 20 MG tablet Take 20 mg by mouth daily.        . clopidogrel (PLAVIX) 75 MG tablet Take 75 mg by mouth daily.        . Coenzyme Q10 (CO Q 10) 100 MG CAPS Take 1 capsule by mouth daily.      . lansoprazole (PREVACID) 30 MG capsule Take 30 mg by mouth daily.        . metoprolol tartrate (LOPRESSOR) 25 MG tablet Take 1/2 tablet once daily.       . Misc  Natural Products (GNP RESVERATROL RED WINE EXT PO) Take 1 capsule by mouth daily.      . Multiple Vitamins-Minerals (CENTRUM SILVER PO) Take 1 tablet by mouth daily.      Marland Kitchen NITROSTAT 0.4 MG SL tablet PLACE 1 TABLET UNDER TONGUE EVERY 5     MINUTES FOR 3 DOSES AS NEEDED.  25 tablet  12  . Omega-3 Fatty Acids (FISH OIL) 1200 MG CAPS Take 1 capsule by mouth daily.      Marland Kitchen oxybutynin (DITROPAN) 5 MG tablet 1 tab po qhs  30 tablet  3  . DISCONTD: AMITRIPTYLINE HCL PO Take by mouth. Pt taking the lowest dose per uroligist        Allergies  Allergen Reactions  . Promethazine Hcl     Possible hallucinations    ROS Review of Systems  Constitutional: Negative for fever, chills, appetite change and fatigue.  HENT: Negative for ear pain, congestion, sore throat, neck stiffness and dental problem.   Eyes: Negative for discharge, redness and visual disturbance.  Respiratory: Negative for cough, chest tightness, shortness of breath and wheezing.   Cardiovascular: Negative for chest pain, palpitations and leg swelling.  Gastrointestinal: Negative for nausea, vomiting, abdominal pain, diarrhea and blood in stool.  Genitourinary: Positive for frequency. Negative for dysuria, urgency, hematuria, flank pain and difficulty urinating.  Musculoskeletal: Negative for myalgias, back pain, joint swelling and arthralgias.  Skin: Negative for pallor and rash.  Neurological: Negative for dizziness, speech difficulty, weakness and headaches.  Hematological: Negative for adenopathy. Does not bruise/bleed easily.  Psychiatric/Behavioral: Negative for confusion and disturbed wake/sleep cycle. The patient is not nervous/anxious.     PE; Blood pressure 114/72, pulse 63, temperature 97.6 F (36.4 C), temperature source Temporal, height 5\' 7"  (1.702 m), weight 161 lb (73.029 kg), SpO2 99.00%. Gen: Alert, well appearing.  Patient is oriented to person, place, time, and situation. AFFECT: pleasant, lucid thought and  speech ENT:  Eyes: no injection, icteris, swelling, or exudate.  EOMI, PERRLA. Nose: no drainage or turbinate edema/swelling.  No injection or focal lesion.  Mouth: lips without lesion/swelling.  Oral mucosa pink and moist.  Dentition intact and without obvious caries or gingival swelling.  Oropharynx without erythema, exudate, or swelling.  Neck - No masses or thyromegaly or limitation in range of motion CV: RRR, no m/r/g.   LUNGS: CTA bilat, nonlabored resps, good aeration in all lung fields. ABD: soft,  NT/ND BS + EXT: no clubbing, cyanosis, or edema.  SKIN: right pinna at the very tip has a 2-3 mm oval papule with rolled borders, central depression.  Pertinent labs:  None today  ASSESSMENT AND PLAN:   Transfer pt today from [facility]: obtain old records.  Urge incontinence He does not seem bothered by the daytime sx's. Will do trial of ditropan 5mg  qhs--watch closely for constipation side effect, which has been a problem for him in the past with these type of meds (tolteridine).  I also encouraged him to get re-established with his urologist, Dr. Earlene Plater, at [facility] in regard to possible dx of interstitial cystitis as well as surveillance for bladder cancer given pt's diagnosis of lynch syndrome.  CONGESTIVE HEART FAILURE, UNSPECIFIED Describes minimal sx's--possibly NYHA class 2 at the worst. Followed closely by cardiology, continue current regimen.  CAD, NATIVE VESSEL The plan is apparently to keep him on plavix + ASA indefinitely given the location of his prior lesion and the mild residual/nonobstructive disease remaining.  Continue daily lansoprazole 30mg .  Cancer of ascending colon Remission. Recent surveillance colonoscopy last week was normal. Continue appropriate GI f/u.   Flu vaccine IM today.  An After Visit Summary was printed and given to the patient.  Return for appt for CPE after 07/2012, earlier if needed.

## 2012-01-22 NOTE — Assessment & Plan Note (Signed)
He does not seem bothered by the daytime sx's. Will do trial of ditropan 5mg  qhs--watch closely for constipation side effect, which has been a problem for him in the past with these type of meds (tolteridine).  I also encouraged him to get re-established with his urologist, Dr. Earlene Plater, at [facility] in regard to possible dx of interstitial cystitis as well as surveillance for bladder cancer given pt's diagnosis of lynch syndrome.

## 2012-01-25 ENCOUNTER — Telehealth: Payer: Self-pay | Admitting: *Deleted

## 2012-01-25 NOTE — Telephone Encounter (Signed)
Attempted to obtain copy of lipid panel from Dr.Halm in [place] ( he moved to [place]). Called old office at [phone] and they would not send results to me without the patient signing a release form. Patient aware of above issue. He will forward copy of lipid panel to Dr.Ross.

## 2012-01-28 ENCOUNTER — Other Ambulatory Visit: Payer: Self-pay | Admitting: *Deleted

## 2012-01-28 MED ORDER — LANSOPRAZOLE 30 MG PO CPDR: 30.0000 mg | DELAYED_RELEASE_CAPSULE | Freq: Every day | ORAL | Status: DC

## 2012-01-28 NOTE — Telephone Encounter (Signed)
Faxed refill request received from pharmacy for LANSOPRAZOLE Last filled by MD on never by our clinic Last seen on 01/16/12 Follow up 2 months RX sent.

## 2012-02-07 DIAGNOSIS — D485 Neoplasm of uncertain behavior of skin: Secondary | ICD-10-CM | POA: Diagnosis not present

## 2012-02-07 DIAGNOSIS — H61009 Unspecified perichondritis of external ear, unspecified ear: Secondary | ICD-10-CM | POA: Diagnosis not present

## 2012-02-08 NOTE — Telephone Encounter (Signed)
Labs received and given to Dr.Ross for review.

## 2012-02-11 ENCOUNTER — Encounter: Payer: Self-pay | Admitting: Family Medicine

## 2012-02-13 ENCOUNTER — Encounter: Payer: Self-pay | Admitting: Oncology

## 2012-02-14 ENCOUNTER — Other Ambulatory Visit: Payer: Self-pay | Admitting: Family Medicine

## 2012-02-14 MED ORDER — METOPROLOL SUCCINATE ER 25 MG PO TB24: 12.5000 mg | ORAL_TABLET | Freq: Every day | ORAL | Status: DC

## 2012-02-14 NOTE — Telephone Encounter (Signed)
Confirmed dose with patient.  He states he is taking Metoprolol Succinate.  This is changed and RX sent.

## 2012-02-14 NOTE — Telephone Encounter (Signed)
Patient needs a refill on metoprolol for 90 day supply called to [facility] [phone] (says that Dr Rubie Maid this at his last visit) he needs this called in as soon as possible  He also needs a 30 day supply called to [facility], he needs this to be called in on Monday, after the 90 day supply so that his insurance will approve it

## 2012-02-15 MED ORDER — METOPROLOL SUCCINATE ER 25 MG PO TB24: 12.5000 mg | ORAL_TABLET | Freq: Every day | ORAL | Status: DC

## 2012-02-15 NOTE — Telephone Encounter (Signed)
RX sent to [facility].

## 2012-03-03 ENCOUNTER — Ambulatory Visit (INDEPENDENT_AMBULATORY_CARE_PROVIDER_SITE_OTHER): Payer: Medicare Other | Admitting: Family Medicine

## 2012-03-03 ENCOUNTER — Encounter: Payer: Self-pay | Admitting: Family Medicine

## 2012-03-03 VITALS — BP 104/70 | HR 56

## 2012-03-03 DIAGNOSIS — F329 Major depressive disorder, single episode, unspecified: Secondary | ICD-10-CM | POA: Insufficient documentation

## 2012-03-03 MED ORDER — CITALOPRAM HYDROBROMIDE 20 MG PO TABS: 20.0000 mg | ORAL_TABLET | Freq: Every day | ORAL | Status: DC

## 2012-03-03 NOTE — Assessment & Plan Note (Signed)
Start citalopram 20mg  po qd. Therapeutic expectations and side effect profile of medication discussed today.  Patient's questions answered. Referred to Dr. Dellia Cloud with [facility] for further counseling expertise. Recheck in office in 3-4 wks.

## 2012-03-03 NOTE — Progress Notes (Signed)
OFFICE NOTE  03/03/2012  CC: No chief complaint on file.    HPI: Patient is a 66 y.o. Caucasian male who is here for depression. Describes well documented past history of this, recently with several months of: feels depressed mood with crying spells periodically, withdrawal behavior, irritability (esp towards his wife and son).  Feels frustration with son, who seems to be displaying some immature behavior.  He thinks about his medical dx, worries that "I'm just waiting to die".   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  Denies SI or HI.  Past meds tried: effexor, caused constipation even on twice per week dosing Zoloft many years ago: caused him to feel odd electrical feeling in extremities.  Pertinent PMH:  Past Medical History  Diagnosis Date  . Cardiomyopathy     EF about 30%  . CAD (coronary artery disease)     apical MI  . Dyslipidemia   . 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 ICD (implantable cardiac defibrillator) in place     ICD - Guidant  . Adjustment disorder with anxiety   . 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 Matthias Hughs)  . History of gastric ulcer 10/2010    Bx benign.  . Interstitial cystitis     Dr. Earlene Plater  . History of diverticulitis of colon   . Urge incontinence 01/22/2012    Nocturia is his biggest issue   . Benign skin lesion     Chondrodermatitis nodularis (right superior helix) --Dr. Terri Piedra   History   Social History  . Marital Status: Married    Spouse Name: N/A    Number of Children: N/A  . Years of Education: N/A   Occupational History  . Not on file.   Social History Main Topics  . Smoking status: Former Smoker    Quit date: 04/10/1979  . Smokeless tobacco: Never Used  . Alcohol Use: Yes   Comment: wine on weekend  . Drug Use: No  . Sexually Active: Not on file   Other Topics Concern  . Not on file   Social History Narrative   Married, one son in early 66s.  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  Eats healthy diet.     MEDS:  Outpatient Prescriptions Prior to Visit  Medication Sig Dispense Refill  . ALPRAZolam (XANAX) 0.25 MG tablet Take 0.25 mg by mouth as needed. For anxiety      . aspirin 81 MG EC tablet Take 81 mg by mouth daily. 1 tabs by mouth daily      . atorvastatin (LIPITOR) 20 MG tablet Take 20 mg by mouth daily.        . clopidogrel (PLAVIX) 75 MG tablet Take 75 mg by mouth daily.        . Coenzyme Q10 (CO Q 10) 100 MG CAPS Take 1 capsule by mouth daily.      . lansoprazole (PREVACID) 30 MG capsule Take 1 capsule (30 mg total) by mouth daily.  90 capsule  1  . metoprolol succinate (TOPROL-XL) 25 MG 24 hr tablet Take 0.5 tablets (12.5 mg total) by mouth daily.  15 tablet  1  . Misc Natural Products (GNP RESVERATROL RED WINE EXT PO) Take 1 capsule by mouth daily.      . Multiple Vitamins-Minerals (CENTRUM SILVER PO) Take 1 tablet by mouth daily.      Marland Kitchen NITROSTAT 0.4 MG SL tablet PLACE 1 TABLET UNDER TONGUE EVERY 5     MINUTES FOR 3 DOSES AS NEEDED.  25 tablet  12  . Omega-3 Fatty Acids (FISH OIL) 1200 MG CAPS Take 1 capsule by mouth daily.      Marland Kitchen oxybutynin (DITROPAN) 5 MG tablet 1 tab po qhs  30 tablet  3    PE: Blood pressure 104/70, pulse 56. Gen: Alert, well appearing.  Patient is oriented to person, place, time, and situation. AFFECT: pleasant.  Displays lucid thought and speech. He almost teared up on occasion when talking about "emptiness" inside. CV: RRR, no m/r/g.   LUNGS: CTA bilat, nonlabored resps, good aeration in all lung fields. Neuro: CN 2-12 intact bilaterally,  strength 5/5 in proximal and distal upper extremities and lower extremities bilaterally.  No sensory deficits.  No tremor.  No disdiadochokinesis.  No ataxia.  Upper extremity and lower extremity DTRs symmetric.  No pronator drift.   IMPRESSION AND PLAN:  Depression Start citalopram 20mg  po qd. Therapeutic expectations and side effect profile of medication discussed today.  Patient's questions answered. Referred to Dr. Dellia Cloud with [facility] for further counseling expertise. Recheck in office in 3-4 wks.  An After Visit Summary was printed and given to the patient.  Spent 25 min with pt today, with >50% of this time spent in counseling for the above problem.  FOLLOW UP: 3-4 wks

## 2012-03-19 ENCOUNTER — Telehealth: Payer: Self-pay | Admitting: Internal Medicine

## 2012-03-19 NOTE — Telephone Encounter (Signed)
New Problem:    Patient called returning a call today.  Please call back.

## 2012-03-19 NOTE — Telephone Encounter (Signed)
Called patient back. He said someone from cardiology called him but did not leave a message. Advised it was not from me, but I will send a message to scheduling to see if they called for a Feb. Appointment.

## 2012-03-21 DIAGNOSIS — H2510 Age-related nuclear cataract, unspecified eye: Secondary | ICD-10-CM | POA: Diagnosis not present

## 2012-03-27 ENCOUNTER — Ambulatory Visit (INDEPENDENT_AMBULATORY_CARE_PROVIDER_SITE_OTHER): Payer: Medicare Other | Admitting: *Deleted

## 2012-03-27 ENCOUNTER — Encounter: Payer: Self-pay | Admitting: Internal Medicine

## 2012-03-27 DIAGNOSIS — Z95810 Presence of automatic (implantable) cardiac defibrillator: Secondary | ICD-10-CM

## 2012-03-27 DIAGNOSIS — I509 Heart failure, unspecified: Secondary | ICD-10-CM | POA: Diagnosis not present

## 2012-03-31 ENCOUNTER — Ambulatory Visit (INDEPENDENT_AMBULATORY_CARE_PROVIDER_SITE_OTHER): Payer: Medicare Other | Admitting: Psychology

## 2012-03-31 DIAGNOSIS — F411 Generalized anxiety disorder: Secondary | ICD-10-CM | POA: Diagnosis not present

## 2012-03-31 LAB — REMOTE ICD DEVICE
BRDY-0002RV: 40 {beats}/min
CHARGE TIME: 13.3 s
DEV-0020ICD: NEGATIVE
RV LEAD AMPLITUDE: 11.1 mv
TOT-0006: 20130915000000
TZAT-0001SLOWVT: 2
TZAT-0013FASTVT: 2
TZAT-0013SLOWVT: 2
TZAT-0018FASTVT: NEGATIVE
TZAT-0018FASTVT: NEGATIVE
TZAT-0018SLOWVT: NEGATIVE
TZST-0001FASTVT: 3
TZST-0001FASTVT: 4
TZST-0001FASTVT: 7
TZST-0001SLOWVT: 4
TZST-0001SLOWVT: 7
TZST-0003FASTVT: 31 J
TZST-0003FASTVT: 31 J
TZST-0003SLOWVT: 17 J
TZST-0003SLOWVT: 26 J
TZST-0003SLOWVT: 31 J
TZST-0003SLOWVT: 31 J
TZST-0003SLOWVT: 31 J
VENTRICULAR PACING ICD: 0 pct

## 2012-04-03 ENCOUNTER — Ambulatory Visit (INDEPENDENT_AMBULATORY_CARE_PROVIDER_SITE_OTHER): Payer: Medicare Other | Admitting: Psychology

## 2012-04-03 DIAGNOSIS — F411 Generalized anxiety disorder: Secondary | ICD-10-CM | POA: Diagnosis not present

## 2012-04-04 ENCOUNTER — Other Ambulatory Visit: Payer: Self-pay | Admitting: *Deleted

## 2012-04-04 NOTE — Telephone Encounter (Signed)
OK to call and authorize 2 tabs of each of these meds.-thx

## 2012-04-04 NOTE — Telephone Encounter (Signed)
Patient notified of Rx x 2 called in to [facility] at [facility].

## 2012-04-04 NOTE — Telephone Encounter (Signed)
PATIENT AND HIS WIFE ARE CURRENTLY AT [facility]. DISCOVERED HE HAD FORGOT TO BRING HIS MEDICATIONS FOR PLAVIX AND METOPROLOL. REQUEST IF YOU CAN CALL IN THESE 2 MEDICATIONS ONLY AND DISPENSE ONLYL 2 PILLS TO COVER TILL HE RETURNS HOME. PLEASE ADVISE. [facility] [place] [phone].

## 2012-04-07 ENCOUNTER — Ambulatory Visit (INDEPENDENT_AMBULATORY_CARE_PROVIDER_SITE_OTHER): Payer: Medicare Other | Admitting: Psychology

## 2012-04-07 DIAGNOSIS — F411 Generalized anxiety disorder: Secondary | ICD-10-CM | POA: Diagnosis not present

## 2012-04-21 ENCOUNTER — Ambulatory Visit (INDEPENDENT_AMBULATORY_CARE_PROVIDER_SITE_OTHER): Payer: Medicare Other | Admitting: Psychology

## 2012-04-21 DIAGNOSIS — F411 Generalized anxiety disorder: Secondary | ICD-10-CM | POA: Diagnosis not present

## 2012-04-22 ENCOUNTER — Other Ambulatory Visit: Payer: Self-pay | Admitting: Internal Medicine

## 2012-05-01 ENCOUNTER — Ambulatory Visit: Payer: Medicare Other | Admitting: Psychology

## 2012-06-02 ENCOUNTER — Ambulatory Visit (INDEPENDENT_AMBULATORY_CARE_PROVIDER_SITE_OTHER): Payer: Medicare Other | Admitting: Internal Medicine

## 2012-06-02 ENCOUNTER — Encounter: Payer: Self-pay | Admitting: Internal Medicine

## 2012-06-02 VITALS — BP 107/65 | HR 67 | Ht 67.0 in | Wt 168.0 lb

## 2012-06-02 DIAGNOSIS — I25810 Atherosclerosis of coronary artery bypass graft(s) without angina pectoris: Secondary | ICD-10-CM

## 2012-06-02 DIAGNOSIS — E785 Hyperlipidemia, unspecified: Secondary | ICD-10-CM | POA: Diagnosis not present

## 2012-06-02 LAB — BASIC METABOLIC PANEL
BUN: 26 mg/dL — ABNORMAL HIGH (ref 6–23)
Chloride: 102 mEq/L (ref 96–112)
GFR: 59.84 mL/min — ABNORMAL LOW (ref >60.00)
Glucose, Bld: 88 mg/dL (ref 70–99)
Potassium: 4 mEq/L (ref 3.5–5.1)
Sodium: 140 mEq/L (ref 135–145)

## 2012-06-02 LAB — CBC WITH DIFFERENTIAL/PLATELET
Basophils Relative: 0.5 % (ref 0.0–3.0)
Eosinophils Relative: 2.1 % (ref 0.0–5.0)
Lymphocytes Relative: 31.2 % (ref 12.0–46.0)
Monocytes Relative: 9 % (ref 3.0–12.0)
Neutrophils Relative %: 57.2 % (ref 43.0–77.0)
Platelets: 249 10*3/uL (ref 150.0–400.0)
RBC: 4.75 Mil/uL (ref 4.22–5.81)
WBC: 5.1 10*3/uL (ref 4.5–10.5)

## 2012-06-02 LAB — LIPID PANEL
HDL: 68.9 mg/dL (ref >39.00)
LDL Cholesterol: 56 mg/dL (ref 0–99)
VLDL: 4.6 mg/dL (ref 0.0–40.0)

## 2012-06-02 LAB — TSH: TSH: 1.19 u[IU]/mL (ref 0.35–5.50)

## 2012-06-02 LAB — AST: AST: 18 U/L (ref 0–37)

## 2012-06-02 NOTE — Progress Notes (Addendum)
HPI Patient is a 67 year old with a history of CAD (status post MI with stent to the LAD in 2005), perfusion study in 2007  which showed an apical infarction, EF of 33. He underwent repeat  angiography that showed a 25% lesion before the stent and patent stent.  30% diagonal, otherwise, no significant disease. RCA was non-dominant.  EF was 25-30%  I saw him in clinic in the fall.  Continues to do well from a cardiac standpoint  No CP  No SOB No edema  No dizziness  Very active Only complaint is 1.  Urinary frequency.  Seen in urology   2  Erectile dysfunction.    Allergies  Allergen Reactions  . Promethazine Hcl     Possible hallucinations    Current Outpatient Prescriptions  Medication Sig Dispense Refill  . ALPRAZolam (XANAX) 0.25 MG tablet Take 0.25 mg by mouth as needed. For anxiety      . aspirin 81 MG EC tablet Take 81 mg by mouth daily. 1 tabs by mouth daily      . atorvastatin (LIPITOR) 20 MG tablet Take 20 mg by mouth daily.        . clopidogrel (PLAVIX) 75 MG tablet Take 75 mg by mouth daily.        . Coenzyme Q10 (CO Q 10) 100 MG CAPS Take 2 capsules by mouth daily.       . lansoprazole (PREVACID) 30 MG capsule Take 1 capsule (30 mg total) by mouth daily.  90 capsule  1  . metoprolol succinate (TOPROL-XL) 25 MG 24 hr tablet Take 0.5 tablets (12.5 mg total) by mouth daily.  15 tablet  1  . Multiple Vitamins-Minerals (CENTRUM SILVER PO) Take 1 tablet by mouth daily.      Marland Kitchen NITROSTAT 0.4 MG SL tablet PLACE 1 TABLET UNDER TONGUE EVERY 5 MINUTES FOR 3 DOSES AS NEEDED.  25 each  6  . Omega-3 Fatty Acids (FISH OIL) 1200 MG CAPS Take 1 capsule by mouth daily.       No current facility-administered medications for this visit.    Past Medical History  Diagnosis Date  . Cardiomyopathy     EF about 30%  . CAD (coronary artery disease)     apical MI  . Dyslipidemia   . 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 ICD (implantable cardiac defibrillator) in  place     ICD - Guidant  . Adjustment disorder with anxiety   . 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 Matthias Hughs)  . History of gastric ulcer 10/2010    Bx benign.  . Interstitial cystitis     Dr. Earlene Plater  . History of diverticulitis of colon   . Urge incontinence 01/22/2012    Nocturia is his biggest issue   . Benign skin lesion     Chondrodermatitis nodularis (right superior helix) --Dr. Terri Piedra    Past Surgical History  Procedure Laterality Date  . Cardiac defibrillator placement  5/07    Guidant ICD placement  . Cardiac defibrillator placement      [employer] - Remote - yes  . Abdominal surgery  2007    partial colectomy for colon cancer  . Colonoscopy      Normal 01/18/2012 (Dr. Matthias Hughs)  . Coronary stent placement  2006  . Tonsillectomy  1972  Family History  Problem Relation Age of Onset  . Cancer Father     died at 78 (colon)  . Cancer Mother     died at 57 (breast ca)  . Cancer Brother     died at 58  . Cancer Sister     died at 71    History   Social History  . Marital Status: Married    Spouse Name: N/A    Number of Children: N/A  . Years of Education: N/A   Occupational History  . Not on file.   Social History Main Topics  . Smoking status: Former Smoker    Quit date: 04/10/1979  . Smokeless tobacco: Never Used  . Alcohol Use: Yes     Comment: wine on weekend  . Drug Use: No  . Sexually Active: Not on file   Other Topics Concern  . Not on file   Social History Narrative   Married, one son in early 2s.  Lives in [place], [place].   One year of college education but held high level/leadership position with [employer] .   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  Eats healthy diet.             Review of Systems:  All systems  reviewed.  They are negative to the above problem except as previously stated.  Vital Signs: BP 107/65  Pulse 67  Ht 5\' 7"  (1.702 m)  Wt 168 lb (76.204 kg)  BMI 26.31 kg/m2  Physical Exam Patient is in NAD HEENT:  Normocephalic, atraumatic. EOMI, PERRLA.  Neck: JVP is normal.  No bruits.  Lungs: clear to auscultation. No rales no wheezes.  Heart: Regular rate and rhythm. Normal S1, S2. No S3.   No significant murmurs. PMI not displaced.  Abdomen:  Supple, nontender. Normal bowel sounds. No masses. No hepatomegaly.  Extremities:   Good distal pulses throughout. No lower extremity edema.  Musculoskeletal :moving all extremities.  Neuro:   alert and oriented x3.  CN II-XII grossly intact.   Assessment and Plan:  1.  Cardiomyopathy  Volume status looks good  BP is a little higher  Will review labs  If OK would try low dose lisinoprl again (2.5)  Has not tolerated in past due to dizziness  2.  CAD  No symptoms of angina.  3.  HL  Keep on statin  Check lipids  4.  Erectile dysfunction.  OK to try Viagra  F/u in the fall.

## 2012-06-02 NOTE — Patient Instructions (Addendum)
LABS TODAY:  Cbc, bmet, tsh, lipid panel, ast  Your physician wants you to follow-up in: 6 months with Dr. Tenny Craw.  You will receive a reminder letter in the mail two months in advance. If you don't receive a letter, please call our office to schedule the follow-up appointment.

## 2012-06-06 ENCOUNTER — Other Ambulatory Visit: Payer: Self-pay | Admitting: Family Medicine

## 2012-06-06 ENCOUNTER — Other Ambulatory Visit: Payer: Self-pay | Admitting: *Deleted

## 2012-06-06 MED ORDER — ALPRAZOLAM 0.5 MG PO TABS: ORAL_TABLET | ORAL | Status: DC

## 2012-06-06 MED ORDER — LISINOPRIL 2.5 MG PO TABS: 2.5000 mg | ORAL_TABLET | Freq: Every day | ORAL | Status: DC

## 2012-06-06 MED ORDER — NITROGLYCERIN 0.4 MG SL SUBL: SUBLINGUAL_TABLET | SUBLINGUAL | Status: DC

## 2012-06-06 NOTE — Telephone Encounter (Signed)
OK, rx filled.  But pls ask pt who Justin Mend, PA is--the person listed as the last prescriber of this med.-thx

## 2012-06-06 NOTE — Telephone Encounter (Signed)
Patient needs meds to fly on plane. Would like to pu Rx tomorrow 06/07/12.

## 2012-06-06 NOTE — Telephone Encounter (Signed)
RX faxed.  Pt states Justin Mend is in practice with Dr. Regino Schultze.

## 2012-06-06 NOTE — Telephone Encounter (Signed)
Faxed request received with dose and instructions.  Previously RX'd by Justin Mend, PA,  Medication requested is correct.

## 2012-06-06 NOTE — Telephone Encounter (Signed)
Med is on list as an as needed med.  We have never prescribed to patient.  Please advise.

## 2012-06-14 ENCOUNTER — Emergency Department (HOSPITAL_BASED_OUTPATIENT_CLINIC_OR_DEPARTMENT_OTHER)
Admission: EM | Admit: 2012-06-14 | Discharge: 2012-06-14 | Disposition: A | Discharge status: Home / Self Care | Payer: Medicare Other | Attending: Emergency Medicine | Admitting: Emergency Medicine

## 2012-06-14 ENCOUNTER — Encounter (HOSPITAL_BASED_OUTPATIENT_CLINIC_OR_DEPARTMENT_OTHER): Payer: Self-pay | Admitting: Emergency Medicine

## 2012-06-14 DIAGNOSIS — Z7982 Long term (current) use of aspirin: Secondary | ICD-10-CM | POA: Diagnosis not present

## 2012-06-14 DIAGNOSIS — Z87891 Personal history of nicotine dependence: Secondary | ICD-10-CM | POA: Diagnosis not present

## 2012-06-14 DIAGNOSIS — Z8719 Personal history of other diseases of the digestive system: Secondary | ICD-10-CM | POA: Insufficient documentation

## 2012-06-14 DIAGNOSIS — R3129 Other microscopic hematuria: Secondary | ICD-10-CM | POA: Diagnosis not present

## 2012-06-14 DIAGNOSIS — Z79899 Other long term (current) drug therapy: Secondary | ICD-10-CM | POA: Diagnosis not present

## 2012-06-14 DIAGNOSIS — Z87448 Personal history of other diseases of urinary system: Secondary | ICD-10-CM | POA: Insufficient documentation

## 2012-06-14 DIAGNOSIS — I2510 Atherosclerotic heart disease of native coronary artery without angina pectoris: Secondary | ICD-10-CM | POA: Insufficient documentation

## 2012-06-14 DIAGNOSIS — Z872 Personal history of diseases of the skin and subcutaneous tissue: Secondary | ICD-10-CM | POA: Insufficient documentation

## 2012-06-14 DIAGNOSIS — Z95810 Presence of automatic (implantable) cardiac defibrillator: Secondary | ICD-10-CM | POA: Diagnosis not present

## 2012-06-14 DIAGNOSIS — R300 Dysuria: Secondary | ICD-10-CM | POA: Diagnosis not present

## 2012-06-14 DIAGNOSIS — Z9861 Coronary angioplasty status: Secondary | ICD-10-CM | POA: Diagnosis not present

## 2012-06-14 DIAGNOSIS — Z8679 Personal history of other diseases of the circulatory system: Secondary | ICD-10-CM | POA: Insufficient documentation

## 2012-06-14 DIAGNOSIS — E785 Hyperlipidemia, unspecified: Secondary | ICD-10-CM | POA: Insufficient documentation

## 2012-06-14 DIAGNOSIS — F4322 Adjustment disorder with anxiety: Secondary | ICD-10-CM | POA: Insufficient documentation

## 2012-06-14 DIAGNOSIS — R350 Frequency of micturition: Secondary | ICD-10-CM | POA: Insufficient documentation

## 2012-06-14 DIAGNOSIS — Z85038 Personal history of other malignant neoplasm of large intestine: Secondary | ICD-10-CM | POA: Diagnosis not present

## 2012-06-14 DIAGNOSIS — Z7902 Long term (current) use of antithrombotics/antiplatelets: Secondary | ICD-10-CM | POA: Diagnosis not present

## 2012-06-14 LAB — URINALYSIS, ROUTINE W REFLEX MICROSCOPIC
Glucose, UA: NEGATIVE mg/dL
Specific Gravity, Urine: 1.019 (ref 1.005–1.030)

## 2012-06-14 LAB — URINE MICROSCOPIC-ADD ON

## 2012-06-14 MED ORDER — CIPROFLOXACIN HCL 500 MG PO TABS: 500.0000 mg | ORAL_TABLET | Freq: Two times a day (BID) | ORAL | Status: DC

## 2012-06-14 NOTE — ED Notes (Signed)
Pt having some dysuria, wanting to be checked for UTI.

## 2012-06-14 NOTE — ED Provider Notes (Signed)
History     CSN: [id number]  Arrival date & time 06/14/12  1321   First MD Initiated Contact with Patient 06/14/12 1500      Chief Complaint  Patient presents with  . Dysuria    (Consider location/radiation/quality/duration/timing/severity/associated sxs/prior treatment) HPI Patient presents to the emergency department with urinary frequency and bladder discomfort.  Patient, states he fell.  He says, microscopic hematuria, and he has multiple bladder issues, which he has seen neurology in the past.  The patient denies chest pain, shortness of breath, back pain, nausea, vomiting, diarrhea, headache, fever, blurred vision, or weakness.  Patient, states he is going on a cruise and wanted to have this issue addressed before he left.  Patient denies anything makes his symptoms, better or worse Past Medical History  Diagnosis Date  . Cardiomyopathy     EF about 30%  . CAD (coronary artery disease)     apical MI  . Dyslipidemia   . 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 ICD (implantable cardiac defibrillator) in place     ICD - Guidant  . Adjustment disorder with anxiety   . 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 Matthias Hughs)  . History of gastric ulcer 10/2010    Bx benign.  . Interstitial cystitis     Dr. Earlene Plater  . History of diverticulitis of colon   . Urge incontinence 01/22/2012    Nocturia is his biggest issue   . Benign skin lesion     Chondrodermatitis nodularis (right superior helix) --Dr. Terri Piedra    Past Surgical History  Procedure Laterality Date  . Cardiac defibrillator placement  5/07    Guidant ICD placement  . Cardiac defibrillator placement      [employer] - Remote - yes  . Abdominal surgery  2007    partial colectomy for colon cancer  . Colonoscopy      Normal 01/18/2012 (Dr. Matthias Hughs)  .  Coronary stent placement  2006  . Tonsillectomy  1972    Family History  Problem Relation Age of Onset  . Cancer Father     died at 9 (colon)  . Cancer Mother     died at 67 (breast ca)  . Cancer Brother     died at 3  . Cancer Sister     died at 44    History  Substance Use Topics  . Smoking status: Former Smoker    Quit date: 04/10/1979  . Smokeless tobacco: Never Used  . Alcohol Use: Yes     Comment: wine on weekend      Review of Systems All other systems negative except as documented in the HPI. All pertinent positives and negatives as reviewed in the HPI. Allergies  Promethazine hcl  Home Medications   Current Outpatient Rx  Name  Route  Sig  Dispense  Refill  . ALPRAZolam (XANAX) 0.5 MG tablet      1 tab po bid prn anxiety   60 tablet   2   . aspirin 81 MG EC tablet   Oral   Take 81 mg by mouth daily. 1 tabs by mouth daily         . atorvastatin (LIPITOR) 20 MG tablet   Oral   Take 20 mg by mouth daily.           . clopidogrel (  PLAVIX) 75 MG tablet   Oral   Take 75 mg by mouth daily.           . Coenzyme Q10 (CO Q 10) 100 MG CAPS   Oral   Take 2 capsules by mouth daily.          . lansoprazole (PREVACID) 30 MG capsule   Oral   Take 1 capsule (30 mg total) by mouth daily.   90 capsule   1   . lisinopril (PRINIVIL,ZESTRIL) 2.5 MG tablet   Oral   Take 1 tablet (2.5 mg total) by mouth daily.   90 tablet   3   . metoprolol succinate (TOPROL-XL) 25 MG 24 hr tablet   Oral   Take 0.5 tablets (12.5 mg total) by mouth daily.   15 tablet   1   . Multiple Vitamins-Minerals (CENTRUM SILVER PO)   Oral   Take 1 tablet by mouth daily.         . nitroGLYCERIN (NITROSTAT) 0.4 MG SL tablet      PLACE 1 TABLET UNDER TONGUE EVERY 5 MINUTES FOR 3 DOSES AS NEEDED.   25 tablet   5   . Omega-3 Fatty Acids (FISH OIL) 1200 MG CAPS   Oral   Take 1 capsule by mouth daily.           BP 108/60  Pulse 67  Temp(Src) 98.2 F (36.8 C)  (Oral)  Resp 16  Ht 5\' 7"  (1.702 m)  Wt 165 lb (74.844 kg)  BMI 25.84 kg/m2  SpO2 98%  Physical Exam  Nursing note and vitals reviewed. Constitutional: He is oriented to person, place, and time. He appears well-developed and well-nourished. No distress.  HENT:  Head: Normocephalic and atraumatic.  Mouth/Throat: Oropharynx is clear and moist.  Eyes: Pupils are equal, round, and reactive to light.  Neck: Normal range of motion. Neck supple.  Cardiovascular: Normal rate, regular rhythm and normal heart sounds.  Exam reveals no gallop and no friction rub.   No murmur heard. Pulmonary/Chest: Effort normal.  Abdominal: Soft. Bowel sounds are normal. He exhibits no distension. There is no tenderness. There is no rebound and no guarding.  Neurological: He is alert and oriented to person, place, and time.  Skin: Skin is warm and dry. No rash noted. No erythema.    ED Course  Procedures (including critical care time)  Labs Reviewed  URINALYSIS, ROUTINE W REFLEX MICROSCOPIC - Abnormal; Notable for the following:    Hgb urine dipstick SMALL (*)    Leukocytes, UA TRACE (*)    All other components within normal limits  URINE MICROSCOPIC-ADD ON   Should states usually has these symptoms his urologist give him antibiotics.  Patient be referred back to his urologist.  Told to return here as needed.  Increase his fluid intake.  Patient is in no acute distress   MDM         Carlyle Dolly, PA-C 06/15/12 2355

## 2012-06-16 NOTE — ED Provider Notes (Signed)
Medical screening examination/treatment/procedure(s) were performed by non-physician practitioner and as supervising physician I was immediately available for consultation/collaboration.  Toy Baker, MD 06/16/12 2029

## 2012-07-03 ENCOUNTER — Other Ambulatory Visit: Payer: Self-pay | Admitting: Internal Medicine

## 2012-07-03 ENCOUNTER — Ambulatory Visit (INDEPENDENT_AMBULATORY_CARE_PROVIDER_SITE_OTHER): Payer: Medicare Other | Admitting: *Deleted

## 2012-07-03 DIAGNOSIS — I509 Heart failure, unspecified: Secondary | ICD-10-CM

## 2012-07-03 DIAGNOSIS — Z95810 Presence of automatic (implantable) cardiac defibrillator: Secondary | ICD-10-CM | POA: Diagnosis not present

## 2012-07-09 DIAGNOSIS — R3915 Urgency of urination: Secondary | ICD-10-CM | POA: Diagnosis not present

## 2012-07-09 DIAGNOSIS — R350 Frequency of micturition: Secondary | ICD-10-CM | POA: Diagnosis not present

## 2012-07-22 ENCOUNTER — Other Ambulatory Visit: Payer: Self-pay | Admitting: Family Medicine

## 2012-07-23 NOTE — Telephone Encounter (Signed)
eScribe request for refill on METOPROLOL Last filled - 02/14/12, #45 X 1 Last seen on - 03/03/12 Follow up - 3-4 weeks for depression RX sent per protocol

## 2012-07-24 LAB — REMOTE ICD DEVICE
BATTERY VOLTAGE: 2.56 V
BRDY-0002RV: 40 {beats}/min
DEVICE MODEL ICD: 121719
PACEART VT: 0
TZAT-0001FASTVT: 1
TZAT-0001SLOWVT: 1
TZAT-0013SLOWVT: 2
TZAT-0018FASTVT: NEGATIVE
TZAT-0018SLOWVT: NEGATIVE
TZON-0003SLOWVT: 352.9 ms
TZST-0001FASTVT: 3
TZST-0001FASTVT: 5
TZST-0001SLOWVT: 3
TZST-0001SLOWVT: 6
TZST-0001SLOWVT: 7
TZST-0003FASTVT: 31 J
TZST-0003FASTVT: 31 J
TZST-0003FASTVT: 31 J
TZST-0003SLOWVT: 31 J
TZST-0003SLOWVT: 31 J
VENTRICULAR PACING ICD: 0 pct
VF: 0

## 2012-07-31 ENCOUNTER — Ambulatory Visit (INDEPENDENT_AMBULATORY_CARE_PROVIDER_SITE_OTHER): Payer: Medicare Other | Admitting: Family Medicine

## 2012-07-31 ENCOUNTER — Encounter: Payer: Self-pay | Admitting: Family Medicine

## 2012-07-31 VITALS — BP 101/66 | HR 62 | Temp 98.1°F | Resp 14 | Wt 164.5 lb

## 2012-07-31 DIAGNOSIS — I509 Heart failure, unspecified: Secondary | ICD-10-CM | POA: Diagnosis not present

## 2012-07-31 DIAGNOSIS — F329 Major depressive disorder, single episode, unspecified: Secondary | ICD-10-CM

## 2012-07-31 MED ORDER — BUPROPION HCL ER (XL) 150 MG PO TB24: 150.0000 mg | ORAL_TABLET | Freq: Every day | ORAL | Status: DC

## 2012-07-31 NOTE — Progress Notes (Signed)
OFFICE NOTE  07/31/2012  CC:  Chief Complaint  Patient presents with  . Follow-up    Discuss Lab Orders [CAD; Hyperlipid; ED]     HPI: Patient is a 67 y.o. Caucasian male who is here for f/u depression. I rx'd citalopram 20mg  qd last visit but he apparently never filled this.  He did go see Dr. Dellia Cloud and found this helpful--he has decided to "back off" on his son Greig Castilla some and give him space.  He has found this to be helpful in that Greig Castilla now seeks out his company a bit more. He wants me to manage his meds, asks if he can try wellbutrin.  Says he still struggles with overall down mood-dysthymic--with occ periods where he is markedly worse--anhedonia, crying spells.  He hides it well b/c his personality is to be jovial with everyone.  No suicidal or homicidal thoughts.  He reiterates a long hx of failed antidepressant trials mainly due to side effect of constipation.  Of note, at recent cardiologist f/u (Dr. Tenny Craw), she recommended he do another trial of lisinopril 2.5mg  qd.  He has not tolerated this in the past b/c of his chronic borderline hypotension--it induced too much orthostatic dizziness.  He is willing to retry it but he has not done so yet.  Pertinent PMH:  Past Medical History  Diagnosis Date  . Cardiomyopathy     EF about 30%  . CAD (coronary artery disease)     apical MI  . Dyslipidemia   . 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 ICD (implantable cardiac defibrillator) in place     ICD - Guidant  . Adjustment disorder with anxiety   . 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 Matthias Hughs)  . History of gastric ulcer 10/2010    Bx benign.  . Interstitial cystitis     Dr. Earlene Plater  . History of diverticulitis of colon   . Urge incontinence 01/22/2012    Nocturia is his biggest issue   . Benign skin lesion      Chondrodermatitis nodularis (right superior helix) --Dr. Terri Piedra  . Lynch syndrome     At higher risk for certain cancers: colon and bladder are the primary ones.   Past Surgical History  Procedure Laterality Date  . Cardiac defibrillator placement  5/07    Guidant ICD placement  . Cardiac defibrillator placement      [employer] - Remote - yes  . Abdominal surgery  2007    partial colectomy for colon cancer  . Colonoscopy      Normal 01/18/2012 (Dr. Matthias Hughs)  . Coronary stent placement  2006  . Tonsillectomy  1972    MEDS:  Outpatient Prescriptions Prior to Visit  Medication Sig Dispense Refill  . ALPRAZolam (XANAX) 0.5 MG tablet 1 tab po bid prn anxiety  60 tablet  2  . aspirin 81 MG EC tablet Take 81 mg by mouth daily. 1 tabs by mouth daily      . atorvastatin (LIPITOR) 20 MG tablet Take 20 mg by mouth daily.        . ciprofloxacin (CIPRO) 500 MG tablet Take 1 tablet (500 mg total) by mouth 2 (two) times daily.  10 tablet  0  . clopidogrel (PLAVIX) 75 MG tablet Take 75 mg by mouth daily.        . Coenzyme  Q10 (CO Q 10) 100 MG CAPS Take 2 capsules by mouth daily.       . lansoprazole (PREVACID) 30 MG capsule Take 1 capsule (30 mg total) by mouth daily.  90 capsule  1  . metoprolol succinate (TOPROL-XL) 25 MG 24 hr tablet Take one-half tablet by  mouth daily  45 tablet  0  . Multiple Vitamins-Minerals (CENTRUM SILVER PO) Take 1 tablet by mouth daily.      . nitroGLYCERIN (NITROSTAT) 0.4 MG SL tablet PLACE 1 TABLET UNDER TONGUE EVERY 5 MINUTES FOR 3 DOSES AS NEEDED.  25 tablet  5  . Omega-3 Fatty Acids (FISH OIL) 1200 MG CAPS Take 1 capsule by mouth daily.      Marland Kitchen lisinopril (PRINIVIL,ZESTRIL) 2.5 MG tablet Take 1 tablet (2.5 mg total) by mouth daily.  90 tablet  3   No facility-administered medications prior to visit.    PE: Blood pressure 101/66, pulse 62, temperature 98.1 F (36.7 C), temperature source Oral, resp. rate 14, weight 164 lb 8 oz (74.617 kg), SpO2  97.00%. Gen: Alert, well appearing.  Patient is oriented to person, place, time, and situation. ENT:  Eyes: no injection, icteris, swelling, or exudate.  EOMI, PERRLA. Nose: no drainage or turbinate edema/swelling.  No injection or focal lesion.  Mouth: lips without lesion/swelling.  Oral mucosa pink and moist.  Dentition intact and without obvious caries or gingival swelling.  Oropharynx without erythema, exudate, or swelling.  Neck - No masses or thyromegaly or limitation in range of motion CV: RRR, no m/r/g.   LUNGS: CTA bilat, nonlabored resps, good aeration in all lung fields. EXT: no clubbing, cyanosis, or edema.   Recent LABS:    Chemistry      Component Value Date/Time   NA 137 08/06/2012 1459   K 4.7 08/06/2012 1459   CL 100 08/06/2012 1459   CO2 30 08/06/2012 1459   BUN 25* 08/06/2012 1459   CREATININE 1.22 08/06/2012 1459   CREATININE 1.3 06/02/2012 1248      Component Value Date/Time   CALCIUM 9.5 08/06/2012 1459   ALKPHOS 52 03/12/2010 0314   AST 18 06/02/2012 1248   ALT 17 03/12/2010 0314   BILITOT 0.6 03/12/2010 0314      IMPRESSION AND PLAN:  1) Depression: trial of wellbutrin XL 150mg  qd.  Therapeutic expectations and side effect profile of medication discussed today.  Patient's questions answered. 2) CHF: pt to start another trial of low dose ACE-I per cardilogist's recommendation.  I recommended he come in for f/u BMET check a couple of weeks after starting this med.  Spent 25 min with pt today, with >50% of this time spent in counseling and care coordination regarding the above problems.   FOLLOW UP: 4 wks

## 2012-08-05 ENCOUNTER — Telehealth: Payer: Self-pay | Admitting: *Deleted

## 2012-08-05 ENCOUNTER — Other Ambulatory Visit: Payer: Medicare Other | Admitting: Lab

## 2012-08-05 ENCOUNTER — Ambulatory Visit (HOSPITAL_BASED_OUTPATIENT_CLINIC_OR_DEPARTMENT_OTHER): Payer: Medicare Other | Admitting: Oncology

## 2012-08-05 ENCOUNTER — Telehealth: Payer: Self-pay | Admitting: Oncology

## 2012-08-05 ENCOUNTER — Encounter: Payer: Self-pay | Admitting: *Deleted

## 2012-08-05 ENCOUNTER — Encounter: Payer: Self-pay | Admitting: Family Medicine

## 2012-08-05 ENCOUNTER — Other Ambulatory Visit: Payer: Self-pay | Admitting: *Deleted

## 2012-08-05 ENCOUNTER — Telehealth: Payer: Self-pay | Admitting: Internal Medicine

## 2012-08-05 VITALS — BP 109/68 | HR 57 | Temp 97.0°F | Resp 18 | Ht 67.0 in | Wt 164.0 lb

## 2012-08-05 DIAGNOSIS — C182 Malignant neoplasm of ascending colon: Secondary | ICD-10-CM

## 2012-08-05 DIAGNOSIS — I2510 Atherosclerotic heart disease of native coronary artery without angina pectoris: Secondary | ICD-10-CM

## 2012-08-05 MED ORDER — METOPROLOL SUCCINATE ER 25 MG PO TB24: ORAL_TABLET | ORAL | Status: DC

## 2012-08-05 NOTE — Telephone Encounter (Signed)
New problem   Pt need to talk to you concerning his test results for labs. Please call pt

## 2012-08-05 NOTE — Telephone Encounter (Signed)
LMTCB

## 2012-08-05 NOTE — Telephone Encounter (Signed)
Pt wants to make sure with Dr. Tenny Craw that his BUN of 26 is ok from 05/2012.  Will review with Dr. Tenny Craw and call patient back.

## 2012-08-05 NOTE — Telephone Encounter (Signed)
Resent rx to mail order and 1 month to local pharmacy.

## 2012-08-05 NOTE — Progress Notes (Signed)
   Jake Carter    OFFICE PROGRESS NOTE   INTERVAL HISTORY:   He returns as scheduled. He reports feeling well. No change in a nodular area at the right parietal scalp. He continues endoscopic followup with Dr. Matthias Carter. He last underwent a negative upper endoscopy and colonoscopy in October of 2013. He continues to have nocturia and is followed by urology.  Objective:  Vital signs in last 24 hours:  Blood pressure 109/68, pulse 57, temperature 97 F (36.1 C), temperature source Oral, resp. rate 18, height 5\' 7"  (1.702 m), weight 164 lb (74.39 kg).    HEENT: Less than 1/2 cm firm nodule at the right parietal scalp (he reports this area has been present chronically). Neck without mass. Lymphatics: Pea-sized left posterior cervical node. No other cervical, supraclavicular, axillary, or inguinal nodes Resp: Lungs clear bilaterally Cardio: Regular rate and rhythm GI: No hepatomegaly, nontender, no mass Vascular: No leg edema   Lab Results:  CEA-pending   Medications: I have reviewed the patient's current medications.  Assessment/Plan: 1.Stage II colon cancer diagnosed in April 2006. He remains in clinical remission.  2. Hereditary nonpolyposis colon cancer syndrome - he continues yearly surveillance endoscopy and colonoscopy procedures by Dr Jake Carter. Last colonoscopy was in October of 2013 to 3. Stable small left posterior cervical lymph node.  4. History of coronary artery disease, status post placement of a defibrillator.  5. 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  6. Discomfort in the left inguinal region -chronic, ? related to interstitial cystitis. He is followed by Dr.Dahlstedt.  7. Gastric ulcers diagnosed in July 2012, biopsy benign, now maintained on Prevacid and followed by Dr.Buccini  Disposition:  He remains  in clinical remission from colon cancer. He will return for an office visit, CEA, and urine cytology in one year. Jake Carter will continue surveillance endoscopies with Dr. Matthias Carter.   Jake Papas, MD  08/05/2012  3:29 PM

## 2012-08-05 NOTE — Telephone Encounter (Signed)
gv and printed appt sched and avs for pt for April 2015  °

## 2012-08-05 NOTE — Telephone Encounter (Signed)
Dr. Tenny Craw spoke with and reassured patient.  BMET ordered at [facility] in [place].

## 2012-08-06 ENCOUNTER — Encounter: Payer: Self-pay | Admitting: Internal Medicine

## 2012-08-06 ENCOUNTER — Other Ambulatory Visit: Payer: Self-pay | Admitting: *Deleted

## 2012-08-06 DIAGNOSIS — I2510 Atherosclerotic heart disease of native coronary artery without angina pectoris: Secondary | ICD-10-CM

## 2012-08-07 ENCOUNTER — Telehealth: Payer: Self-pay | Admitting: *Deleted

## 2012-08-07 LAB — BASIC METABOLIC PANEL
CO2: 30 mEq/L (ref 19–32)
Calcium: 9.5 mg/dL (ref 8.4–10.5)
Chloride: 100 mEq/L (ref 96–112)
Creat: 1.22 mg/dL (ref 0.50–1.35)
Glucose, Bld: 71 mg/dL (ref 70–99)

## 2012-08-07 NOTE — Telephone Encounter (Signed)
Notified of CEA results-normal

## 2012-08-07 NOTE — Telephone Encounter (Signed)
Message copied by Wandalee Ferdinand on Thu Aug 07, 2012 12:56 PM ------      Message from: Thornton Papas B      Created: Wed Aug 06, 2012  8:34 PM       Please call patient, cea is normal ------

## 2012-08-13 ENCOUNTER — Other Ambulatory Visit: Payer: Self-pay | Admitting: *Deleted

## 2012-08-26 ENCOUNTER — Ambulatory Visit (INDEPENDENT_AMBULATORY_CARE_PROVIDER_SITE_OTHER): Payer: Medicare Other | Admitting: Urology

## 2012-08-26 DIAGNOSIS — R350 Frequency of micturition: Secondary | ICD-10-CM | POA: Diagnosis not present

## 2012-08-26 DIAGNOSIS — R351 Nocturia: Secondary | ICD-10-CM

## 2012-08-27 ENCOUNTER — Encounter: Payer: Self-pay | Admitting: Family Medicine

## 2012-08-27 ENCOUNTER — Ambulatory Visit (INDEPENDENT_AMBULATORY_CARE_PROVIDER_SITE_OTHER): Payer: Medicare Other | Admitting: Family Medicine

## 2012-08-27 VITALS — BP 99/69 | HR 60 | Temp 97.4°F | Resp 14

## 2012-08-27 DIAGNOSIS — F329 Major depressive disorder, single episode, unspecified: Secondary | ICD-10-CM

## 2012-08-27 DIAGNOSIS — F419 Anxiety disorder, unspecified: Secondary | ICD-10-CM | POA: Insufficient documentation

## 2012-08-27 DIAGNOSIS — F341 Dysthymic disorder: Secondary | ICD-10-CM | POA: Diagnosis not present

## 2012-08-27 MED ORDER — ALPRAZOLAM 0.5 MG PO TABS: ORAL_TABLET | ORAL | Status: DC

## 2012-08-27 MED ORDER — BUPROPION HCL 75 MG PO TABS: ORAL_TABLET | ORAL | Status: DC

## 2012-08-27 NOTE — Patient Instructions (Addendum)
Take bupropion 75mg  tab: 1 tab once a day for 2 days, then 1/2 tab once daily for 2d, then stop. Take your xanax 0.5mg  tabs: 1-2 tabs twice a day for the next 4d.

## 2012-08-27 NOTE — Progress Notes (Signed)
OFFICE NOTE  08/27/2012  CC:  Chief Complaint  Patient presents with  . Follow-up    1-mth. Medication mgt. [Antidepressant]     HPI: Patient is a 67 y.o. Caucasian male who is here for 1 mo f/u depression. Long history of GAD with secondary episodes of moderate depression. Treated successfully in the past with effexor but he ended up getting off of this b/c of intolerable constipation, even on low doses. Other antidepressants he has tried and failed or had intolerance to are citalopram and zoloft.   Now, our latest med trial--wellbutrin XL--seems to be making him feel worse.  Feels more anxious, has insomnia, "feel like I'm gonna crawl outta my skin".  The worst of the sx's have actually been only in the last few days, coincident with starting to ween himself off of the med by taking it every other day.  No suicidal or homicidal ideation. No signs of hypomania/mania. Urology did start him on DDAVP, which he started last night but he notes he is afraid of this med b/c of the warnings associated with this med when used in pt's with hx of CHF.    Pertinent PMH:  Past Medical History  Diagnosis Date  . Cardiomyopathy     EF about 30%  . CAD (coronary artery disease)     apical MI  . Dyslipidemia   . 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 ICD (implantable cardiac defibrillator) in place     ICD - Guidant  . Adjustment disorder with anxiety   . 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 Matthias Hughs)  . History of gastric ulcer 10/2010    Bx benign.  . Interstitial cystitis     Dr. Earlene Plater  . History of diverticulitis of colon   . Urge incontinence 01/22/2012    Nocturia is his biggest issue   . Benign skin lesion     Chondrodermatitis nodularis (right superior helix) --Dr. Terri Piedra  . Lynch syndrome     At higher risk for  certain cancers: colon and bladder are the primary ones.  . Peyronie's disease     [facility]    MEDS:  Outpatient Prescriptions Prior to Visit  Medication Sig Dispense Refill  . ALPRAZolam (XANAX) 0.5 MG tablet 1 tab po bid prn anxiety  60 tablet  2  . aspirin 81 MG EC tablet Take 81 mg by mouth daily. 1 tabs by mouth daily      . atorvastatin (LIPITOR) 20 MG tablet Take 20 mg by mouth daily.        . clopidogrel (PLAVIX) 75 MG tablet Take 75 mg by mouth daily.        . Coenzyme Q10 (CO Q 10) 100 MG CAPS Take 2 capsules by mouth daily.       . lansoprazole (PREVACID) 30 MG capsule Take 1 capsule (30 mg total) by mouth daily.  90 capsule  1  . metoprolol succinate (TOPROL-XL) 25 MG 24 hr tablet Take one-half tablet by  mouth daily  15 tablet  0  . Multiple Vitamins-Minerals (CENTRUM SILVER PO) Take 1 tablet by mouth daily.      . nitroGLYCERIN (NITROSTAT) 0.4 MG SL tablet PLACE 1 TABLET UNDER TONGUE EVERY 5 MINUTES FOR 3 DOSES AS NEEDED.  25 tablet  5  . Omega-3 Fatty Acids (FISH OIL) 1200 MG  CAPS Take 1 capsule by mouth daily.      Marland Kitchen buPROPion (WELLBUTRIN XL) 150 MG 24 hr tablet Take 1 tablet (150 mg total) by mouth daily.  30 tablet  1   No facility-administered medications prior to visit.    PE: Blood pressure 99/69, pulse 60, temperature 97.4 F (36.3 C), temperature source Oral, resp. rate 14, weight 0 lb (0 kg), SpO2 98.00%. Wt Readings from Last 2 Encounters:  08/05/12 164 lb (74.39 kg)  07/31/12 164 lb 8 oz (74.617 kg)   Gen: alert, oriented x 4, affect pleasant.  Lucid thinking and conversation noted. HEENT: PERRLA, EOMI.   Neck: no LAD, mass, or thyromegaly. CV: RRR, no m/r/g LUNGS: CTA bilat, nonlabored. NEURO: no tremor or tics noted on observation.  Coordination intact. CN 2-12 grossly intact bilaterally, strength 5/5 in all extremeties.  No ataxia.   IMPRESSION AND PLAN:  Anxiety with secondary moderate depressive episodes. Intolerance to wellbutrin vs  withdrawal/rebound effects from recently starting to ween himself off this med. Plan: Take bupropion 75mg  tab: 1 tab once a day for 2 days, then 1/2 tab once daily for 2d, then stop. Take your xanax 0.5mg  tabs: 1-2 tabs twice a day for the next 4d.  An After Visit Summary was printed and given to the patient.  Spent 25 min with pt today, with >50% of this time spent in counseling and care coordination regarding the above problems.  FOLLOW UP: 1 wk, at which time we'll hopefully discuss trial of different antidepressant.

## 2012-08-29 ENCOUNTER — Ambulatory Visit: Payer: Self-pay | Admitting: Family Medicine

## 2012-09-02 ENCOUNTER — Encounter: Payer: Self-pay | Admitting: Family Medicine

## 2012-09-02 ENCOUNTER — Ambulatory Visit (INDEPENDENT_AMBULATORY_CARE_PROVIDER_SITE_OTHER): Payer: Medicare Other | Admitting: Family Medicine

## 2012-09-02 VITALS — BP 110/70 | HR 61 | Temp 97.5°F | Resp 16 | Wt 164.0 lb

## 2012-09-02 DIAGNOSIS — F341 Dysthymic disorder: Secondary | ICD-10-CM

## 2012-09-02 DIAGNOSIS — F419 Anxiety disorder, unspecified: Secondary | ICD-10-CM

## 2012-09-02 MED ORDER — MIRTAZAPINE 7.5 MG PO TABS: ORAL_TABLET | ORAL | Status: DC

## 2012-09-02 NOTE — Assessment & Plan Note (Signed)
He is back to baseline s/p recent intolerance to wellbutrin. Plan is to start trial of mirtazapine 7.5mg  x 2 wks, then increase to 15mg  qhs. Therapeutic expectations and side effect profile of medication discussed today.  Patient's questions answered.  F/u 17mo in office, earlier if needed.

## 2012-09-02 NOTE — Progress Notes (Signed)
OFFICE NOTE  09/02/2012  CC:  Chief Complaint  Patient presents with  . Follow-up    1-wk. [Depression]     HPI: Patient is a 67 y.o. Caucasian male who is here for 1 wk f/u depression. Last week we weened him off of wellbutrin due to concerns about side effects after he had been on it for a few weeks. He has felt much better except for a HA yesterday, which was the first day he was completely OFF of his wellbutrin.  Has taken xanax bid as recommended during this time. Feeling fine today. He is still not sure that the DDAVP didn't have something to do with the way he felt recently b/c he did take it the night before he had his worst day of sx's. He did say the night AFTER this he only got up once to urinate, though he only took the one dose of DDAVP the night prior. At any rate, he is will to retry this med to see how it makes him feel.  He then will try the new antidepressant we discussed today: remeron. 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  Pertinent PMH:  Past Medical History  Diagnosis Date  . Cardiomyopathy     EF about 30%  . CAD (coronary artery disease)     apical MI  . Dyslipidemia   . 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 ICD (implantable cardiac defibrillator) in place     ICD - Guidant  . Adjustment disorder with anxiety   . 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 Matthias Hughs)  . History of gastric ulcer 10/2010    Bx benign.  . Interstitial cystitis     Dr. Earlene Plater  . History of diverticulitis of colon   . Urge incontinence 01/22/2012    Nocturia is his biggest issue   . Benign skin lesion     Chondrodermatitis nodularis (right superior helix) --Dr. Terri Piedra  . Lynch syndrome     At higher risk for certain  cancers: colon and bladder are the primary ones.  . Peyronie's disease     [facility]    MEDS:  Outpatient Prescriptions Prior to Visit  Medication Sig Dispense Refill  . ALPRAZolam (XANAX) 0.5 MG tablet 1-2 tabs po bid prn anxiety  60 tablet  2  . aspirin 81 MG EC tablet Take 81 mg by mouth daily. 1 tabs by mouth daily      . atorvastatin (LIPITOR) 20 MG tablet Take 20 mg by mouth daily.        . clopidogrel (PLAVIX) 75 MG tablet Take 75 mg by mouth daily.        . Coenzyme Q10 (CO Q 10) 100 MG CAPS Take 2 capsules by mouth daily.       . lansoprazole (PREVACID) 30 MG capsule Take 1 capsule (30 mg total) by mouth daily.  90 capsule  1  . metoprolol succinate (TOPROL-XL) 25 MG 24 hr tablet Take one-half tablet by  mouth daily  15 tablet  0  . Multiple Vitamins-Minerals (CENTRUM SILVER PO) Take 1 tablet by mouth daily.      . nitroGLYCERIN (NITROSTAT) 0.4 MG SL tablet PLACE 1 TABLET UNDER TONGUE EVERY 5 MINUTES FOR 3  DOSES AS NEEDED.  25 tablet  5  . Omega-3 Fatty Acids (FISH OIL) 1200 MG CAPS Take 1 capsule by mouth daily.      Marland Kitchen desmopressin (DDAVP) 0.2 MG tablet Take 0.4-0.6 mg by mouth at bedtime.      Marland Kitchen buPROPion (WELLBUTRIN) 75 MG tablet 1 tab po qd x 2d, then 1/2 tab po qd x 2d, then stop  3 tablet  0   No facility-administered medications prior to visit.    PE: Blood pressure 110/70, pulse 61, temperature 97.5 F (36.4 C), temperature source Oral, resp. rate 16, weight 164 lb (74.39 kg), SpO2 97.00%. Wt Readings from Last 2 Encounters:  09/02/12 164 lb (74.39 kg)  08/05/12 164 lb (74.39 kg)  Gen: alert, oriented x 4, affect pleasant.  Lucid thinking and conversation noted. HEENT: PERRLA, EOMI.   Neck: no LAD, mass, or thyromegaly. CV: RRR, no m/r/g LUNGS: CTA bilat, nonlabored. NEURO: no tremor or tics noted on observation.  Coordination intact. CN 2-12 grossly intact bilaterally, strength 5/5 in all extremeties.  No ataxia.  DTRs 1+ bilat in patellar  regions.   IMPRESSION AND PLAN:  Anxiety and depression He is back to baseline s/p recent intolerance to wellbutrin. Plan is to start trial of mirtazapine 7.5mg  x 2 wks, then increase to 15mg  qhs. Therapeutic expectations and side effect profile of medication discussed today.  Patient's questions answered.  F/u 8mo in office, earlier if needed.  An After Visit Summary was printed and given to the patient.

## 2012-09-08 ENCOUNTER — Telehealth: Payer: Self-pay | Admitting: Internal Medicine

## 2012-09-08 NOTE — Telephone Encounter (Signed)
New Prob      Pt states his defib is beeping and would like to speak to someone about this. Please call.

## 2012-09-09 ENCOUNTER — Telehealth: Payer: Self-pay | Admitting: *Deleted

## 2012-09-09 NOTE — Telephone Encounter (Signed)
Pt called on 09-08-12 and phone note was not transferred to device clinic. Pt calls on 09-09-12 being a little upset with no return phone call. Pt would like to stop beeping coming from device. Pt advised can stop by and beep be turned off. Pt will call morning of day when stopping by. Phone number given to device clinic.

## 2012-09-30 ENCOUNTER — Ambulatory Visit (INDEPENDENT_AMBULATORY_CARE_PROVIDER_SITE_OTHER): Payer: Medicare Other | Admitting: Urology

## 2012-09-30 DIAGNOSIS — N529 Male erectile dysfunction, unspecified: Secondary | ICD-10-CM

## 2012-09-30 DIAGNOSIS — N486 Induration penis plastica: Secondary | ICD-10-CM

## 2012-09-30 DIAGNOSIS — N400 Enlarged prostate without lower urinary tract symptoms: Secondary | ICD-10-CM

## 2012-09-30 DIAGNOSIS — R3915 Urgency of urination: Secondary | ICD-10-CM | POA: Diagnosis not present

## 2012-09-30 DIAGNOSIS — N320 Bladder-neck obstruction: Secondary | ICD-10-CM | POA: Diagnosis not present

## 2012-10-07 ENCOUNTER — Encounter: Payer: Self-pay | Admitting: *Deleted

## 2012-10-07 ENCOUNTER — Ambulatory Visit (INDEPENDENT_AMBULATORY_CARE_PROVIDER_SITE_OTHER): Payer: Medicare Other | Admitting: Internal Medicine

## 2012-10-07 ENCOUNTER — Encounter: Payer: Self-pay | Admitting: Internal Medicine

## 2012-10-07 ENCOUNTER — Encounter (HOSPITAL_COMMUNITY): Payer: Self-pay | Admitting: Pharmacy Technician

## 2012-10-07 VITALS — BP 98/62 | HR 59 | Ht 67.0 in | Wt 164.5 lb

## 2012-10-07 DIAGNOSIS — I509 Heart failure, unspecified: Secondary | ICD-10-CM | POA: Diagnosis not present

## 2012-10-07 DIAGNOSIS — I2510 Atherosclerotic heart disease of native coronary artery without angina pectoris: Secondary | ICD-10-CM | POA: Diagnosis not present

## 2012-10-07 DIAGNOSIS — Z95810 Presence of automatic (implantable) cardiac defibrillator: Secondary | ICD-10-CM | POA: Diagnosis not present

## 2012-10-07 LAB — BASIC METABOLIC PANEL
BUN: 26 mg/dL — ABNORMAL HIGH (ref 6–23)
CO2: 30 mEq/L (ref 19–32)
Calcium: 9.5 mg/dL (ref 8.4–10.5)
Creatinine, Ser: 1.1 mg/dL (ref 0.4–1.5)

## 2012-10-07 LAB — CBC WITH DIFFERENTIAL/PLATELET
Basophils Relative: 0.3 % (ref 0.0–3.0)
Eosinophils Absolute: 0.1 10*3/uL (ref 0.0–0.7)
Lymphocytes Relative: 25.1 % (ref 12.0–46.0)
MCHC: 33.8 g/dL (ref 30.0–36.0)
MCV: 83.6 fl (ref 78.0–100.0)
Monocytes Absolute: 0.6 10*3/uL (ref 0.1–1.0)
Neutrophils Relative %: 64.5 % (ref 43.0–77.0)
Platelets: 242 10*3/uL (ref 150.0–400.0)
RBC: 4.64 Mil/uL (ref 4.22–5.81)
WBC: 7.1 10*3/uL (ref 4.5–10.5)

## 2012-10-07 NOTE — Patient Instructions (Signed)
Generator change on 10/20/12  See instruction sheet

## 2012-10-08 ENCOUNTER — Other Ambulatory Visit: Payer: Self-pay | Admitting: *Deleted

## 2012-10-08 ENCOUNTER — Encounter: Payer: Self-pay | Admitting: Internal Medicine

## 2012-10-08 DIAGNOSIS — I509 Heart failure, unspecified: Secondary | ICD-10-CM

## 2012-10-08 NOTE — Assessment & Plan Note (Signed)
His [employer] device has reached elective replacement. I've discussed the treatment options with the patient. Specifically not placing a new device was discussed. He clearly has an indication for ICD implantation however. The risk, goals, benefits, and expectations of ICD implantation have been discussed with the patient, and he wishes to proceed. This will be scheduled in the next ffew weeks.

## 2012-10-08 NOTE — Assessment & Plan Note (Signed)
He remains active and denies anginal symptoms. No change in medical therapy. 

## 2012-10-08 NOTE — Progress Notes (Signed)
HPI [place] returns today for followup. He is a very pleasant 66-year-old man with an ischemic cardiomyopathy, chronic class I systolic heart failure, an ejection fraction of 30%, status post ICD implantation. In the interim, he has been stable. He denies chest pain, shortness of breath, or syncope. No ICD shocks. No peripheral edema or claudication. Allergies  Allergen Reactions  . Promethazine Hcl Other (See Comments)    Possible hallucinations     Current Outpatient Prescriptions  Medication Sig Dispense Refill  . aspirin 81 MG EC tablet Take 81 mg by mouth daily. 1 tabs by mouth daily      . atorvastatin (LIPITOR) 20 MG tablet Take 20 mg by mouth daily.        . clopidogrel (PLAVIX) 75 MG tablet Take 75 mg by mouth daily.        . Coenzyme Q10 (CO Q 10) 100 MG CAPS Take 2 capsules by mouth daily.       . lansoprazole (PREVACID) 30 MG capsule Take 1 capsule (30 mg total) by mouth daily.  90 capsule  1  . Multiple Vitamins-Minerals (CENTRUM SILVER PO) Take 1 tablet by mouth daily.      . Omega-3 Fatty Acids (FISH OIL) 1200 MG CAPS Take 1 capsule by mouth daily.      . ALPRAZolam (XANAX) 0.5 MG tablet Take 0.5-1 mg by mouth 2 (two) times daily as needed for anxiety.      . metoprolol succinate (TOPROL-XL) 25 MG 24 hr tablet Take 12.5 mg by mouth daily.      . nitroGLYCERIN (NITROSTAT) 0.4 MG SL tablet Place 0.4 mg under the tongue every 5 (five) minutes as needed for chest pain.       No current facility-administered medications for this visit.     Past Medical History  Diagnosis Date  . Cardiomyopathy     EF about 30%  . CAD (coronary artery disease)     apical MI  . Dyslipidemia   . 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 ICD (implantable cardiac defibrillator) in place     ICD - Guidant  . Adjustment disorder with anxiety   . 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  Heredetary nonpolyposis  cancer syndrome (Lynch syndrome)--gets yearly colonoscopy (Dr. Buccini)  . History of gastric ulcer 10/2010    Bx benign.  . Interstitial cystitis     Dr. Davis  . History of diverticulitis of colon   . Urge incontinence 01/22/2012    Nocturia is his biggest issue   . Benign skin lesion     Chondrodermatitis nodularis (right superior helix) --Dr. Lupton  . Lynch syndrome     At higher risk for certain cancers: colon and bladder are the primary ones.  . Peyronie's disease     [facility]    ROS:   All systems reviewed and negative except as noted in the HPI.   Past Surgical History  Procedure Laterality Date  . Cardiac defibrillator placement  5/07    Guidant ICD placement  . Cardiac defibrillator placement      [employer] - Remote - yes  . Abdominal surgery  2007    partial colectomy for colon cancer  . Colonoscopy      Normal 01/18/2012 (Dr. Buccini)  . Coronary stent placement  2006  . Tonsillectomy  1972     Family History  Problem Relation Age   of Onset  . Cancer Father     died at 66 (colon)  . Cancer Mother     died at 64 (breast ca)  . Cancer Brother     died at 49  . Cancer Sister     died at 46     History   Social History  . Marital Status: Married    Spouse Name: [place]    Number of Children: [place]  . Years of Education: [place]   Occupational History  . Not on file.   Social History Main Topics  . Smoking status: Former Smoker    Quit date: 04/10/1979  . Smokeless tobacco: Never Used  . Alcohol Use: Yes     Comment: wine on weekend  . Drug Use: No  . Sexually Active: Not on file   Other Topics Concern  . Not on file   Social History Narrative   Married, one son in early 20s.  [place]ives in [place] .   One year of college education but held high level/leadership position with [employer].   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  Eats healthy diet.               BP 98/62  Pulse 59  Ht 5' 7" (1.702 m)  Wt 164 lb 8 oz (74.617 kg)  BMI 25.76 kg/m2  Physical Exam:  Well appearing 66-year-old man, NAD HEENT: Unremarkable Neck:  No JVD, no thyromegally Back:  No CVA tenderness Lungs:  Clear with no wheezes, rales, or rhonchi. HEART:  Regular rate rhythm, no murmurs, no rubs, no clicks Abd:  soft, positive bowel sounds, no organomegally, no rebound, no guarding Ext:  2 plus pulses, no edema, no cyanosis, no clubbing Skin:  No rashes no nodules Neuro:  CN II through XII intact, motor grossly intact   DEVICE  Normal device function.  See PaceArt for details. He has reached elective replacement  Assess/Plan: 

## 2012-10-09 ENCOUNTER — Other Ambulatory Visit: Payer: Self-pay | Admitting: Family Medicine

## 2012-10-09 MED ORDER — METOPROLOL SUCCINATE ER 25 MG PO TB24: 12.5000 mg | ORAL_TABLET | Freq: Every day | ORAL | Status: DC

## 2012-10-09 MED ORDER — LANSOPRAZOLE 30 MG PO CPDR: 30.0000 mg | DELAYED_RELEASE_CAPSULE | Freq: Every day | ORAL | Status: DC

## 2012-10-12 ENCOUNTER — Encounter: Payer: Self-pay | Admitting: Family Medicine

## 2012-10-13 ENCOUNTER — Other Ambulatory Visit: Payer: Self-pay | Admitting: Family Medicine

## 2012-10-13 ENCOUNTER — Ambulatory Visit (INDEPENDENT_AMBULATORY_CARE_PROVIDER_SITE_OTHER): Payer: Medicare Other | Admitting: Family Medicine

## 2012-10-13 ENCOUNTER — Encounter: Payer: Self-pay | Admitting: Family Medicine

## 2012-10-13 VITALS — BP 94/58 | HR 63 | Temp 97.7°F | Resp 16

## 2012-10-13 DIAGNOSIS — F329 Major depressive disorder, single episode, unspecified: Secondary | ICD-10-CM

## 2012-10-13 DIAGNOSIS — F419 Anxiety disorder, unspecified: Secondary | ICD-10-CM

## 2012-10-13 DIAGNOSIS — T50905A Adverse effect of unspecified drugs, medicaments and biological substances, initial encounter: Secondary | ICD-10-CM

## 2012-10-13 DIAGNOSIS — T887XXA Unspecified adverse effect of drug or medicament, initial encounter: Secondary | ICD-10-CM

## 2012-10-13 DIAGNOSIS — F341 Dysthymic disorder: Secondary | ICD-10-CM

## 2012-10-13 MED ORDER — LANSOPRAZOLE 30 MG PO CPDR: 30.0000 mg | DELAYED_RELEASE_CAPSULE | Freq: Every day | ORAL | Status: DC

## 2012-10-13 MED ORDER — CLOPIDOGREL BISULFATE 75 MG PO TABS: 75.0000 mg | ORAL_TABLET | Freq: Every day | ORAL | Status: DC

## 2012-10-13 MED ORDER — METOPROLOL SUCCINATE ER 25 MG PO TB24: 12.5000 mg | ORAL_TABLET | Freq: Every day | ORAL | Status: DC

## 2012-10-13 MED ORDER — VENLAFAXINE HCL ER 37.5 MG PO CP24: 37.5000 mg | ORAL_CAPSULE | Freq: Every day | ORAL | Status: DC

## 2012-10-13 NOTE — Telephone Encounter (Signed)
Filled metoprolol and lansoprazole per [facility] protocol.

## 2012-10-13 NOTE — Progress Notes (Addendum)
OFFICE NOTE  10/13/2012  CC:  Chief Complaint  Patient presents with  . Depression     HPI: Patient is a 67 y.o. Caucasian male who is here for depression.  I last saw him about 6 wks ago and our plan was to start him on remeron. He took this for a couple of weeks and he felt mentally "weird" and almost more depressed so he stopped it and these specific problems resolved. Appetite very poor, having increased sleep, just wants to lay around and not be around anyone, skipped church yesterday.  +Anhedonia.  No SI or HI.  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  Excessive guilt, esp about the way the depression makes him act towards his wife.  Also guilty feelings b/c he says he has a blessed life and "no reason to be depressed".  He is embarrassed about this problem so he doesn't want to see a psychiatrist or counselor--wants me to put him back on the only med that ever helped and this is effexor.  He got off of it b/c it caused unrelenting constipation.  No SI or HI.  Currently his BM pattern is once daily, color brown, consistency is soft.  Pertinent PMH:  Past Medical History  Diagnosis Date  . Cardiomyopathy     EF about 30%  . CAD (coronary artery disease)     apical MI  . Dyslipidemia   . 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 ICD (implantable cardiac defibrillator) in place     ICD - Guidant  . Adjustment disorder with anxiety   . 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 Matthias Hughs)  . History of gastric ulcer 10/2010    Bx benign.  . Interstitial cystitis     Dr. Earlene Plater  . History of diverticulitis of colon   . BPH (benign prostatic hypertrophy) 01/22/2012    Nocturia is his biggest issue   . Benign skin lesion     Chondrodermatitis nodularis (right  superior helix) --Dr. Terri Piedra  . Lynch syndrome     At higher risk for certain cancers: colon and bladder are the primary ones.  . Peyronie's disease     [facility]    MEDS:  Outpatient Prescriptions Prior to Visit  Medication Sig Dispense Refill  . ALPRAZolam (XANAX) 0.5 MG tablet Take 0.5-1 mg by mouth 2 (two) times daily as needed for anxiety.      Marland Kitchen aspirin 81 MG EC tablet Take 81 mg by mouth daily. 1 tabs by mouth daily      . atorvastatin (LIPITOR) 20 MG tablet Take 20 mg by mouth daily.        . clopidogrel (PLAVIX) 75 MG tablet Take 75 mg by mouth daily.        . Coenzyme Q10 (CO Q 10) 100 MG CAPS Take 2 capsules by mouth daily.       . lansoprazole (PREVACID) 30 MG capsule Take 1 capsule (30 mg total) by mouth daily.  90 capsule  1  . metoprolol succinate (TOPROL-XL) 25 MG 24 hr tablet Take 0.5 tablets (12.5 mg total) by mouth daily.  90 tablet  1  . Multiple Vitamins-Minerals (CENTRUM SILVER PO) Take 1 tablet by mouth daily.      . nitroGLYCERIN (NITROSTAT)  0.4 MG SL tablet Place 0.4 mg under the tongue every 5 (five) minutes as needed for chest pain.      . Omega-3 Fatty Acids (FISH OIL) 1200 MG CAPS Take 1 capsule by mouth daily.       No facility-administered medications prior to visit.    PE: Blood pressure 94/58, pulse 63, temperature 97.7 F (36.5 C), temperature source Oral, resp. rate 16, SpO2 98.00%. Gen: Alert, well appearing.  Patient is oriented to person, place, time, and situation. AFFECT: congenial, good eye contact, well-kempt.  He does have some tearfulness when talking through things today.  Thought and speech is lucid.  IMPRESSION AND PLAN:  Anxiety and depression Restart effexor XR 37.5 mg caps, 1 qd.  Therapeutic expectations and side effect profile of medication discussed today.  Patient's questions answered.   Take senakot S OTC: 1 qhs to try to keep bowel habits regular while on this med since this has been the main issue in the past that has  caused problems for this pt while on this med.  Of note, he is scheduled to get his ICD replaced in 7d b/c his battery is almost out.  Spent 30 min with pt today, with >50% of this time spent in counseling and care coordination regarding the above problems.  FOLLOW UP: 6wks f/u depression

## 2012-10-19 DIAGNOSIS — I2589 Other forms of chronic ischemic heart disease: Secondary | ICD-10-CM | POA: Diagnosis not present

## 2012-10-19 DIAGNOSIS — I252 Old myocardial infarction: Secondary | ICD-10-CM | POA: Diagnosis not present

## 2012-10-19 DIAGNOSIS — Z7982 Long term (current) use of aspirin: Secondary | ICD-10-CM | POA: Diagnosis not present

## 2012-10-19 DIAGNOSIS — Z79899 Other long term (current) drug therapy: Secondary | ICD-10-CM | POA: Diagnosis not present

## 2012-10-19 DIAGNOSIS — Z888 Allergy status to other drugs, medicaments and biological substances status: Secondary | ICD-10-CM | POA: Diagnosis not present

## 2012-10-19 DIAGNOSIS — Z87891 Personal history of nicotine dependence: Secondary | ICD-10-CM | POA: Diagnosis not present

## 2012-10-19 DIAGNOSIS — N3010 Interstitial cystitis (chronic) without hematuria: Secondary | ICD-10-CM | POA: Diagnosis not present

## 2012-10-19 DIAGNOSIS — Z85038 Personal history of other malignant neoplasm of large intestine: Secondary | ICD-10-CM | POA: Diagnosis not present

## 2012-10-19 DIAGNOSIS — I472 Ventricular tachycardia: Secondary | ICD-10-CM | POA: Diagnosis not present

## 2012-10-19 DIAGNOSIS — F4322 Adjustment disorder with anxiety: Secondary | ICD-10-CM | POA: Diagnosis not present

## 2012-10-19 DIAGNOSIS — I5020 Unspecified systolic (congestive) heart failure: Secondary | ICD-10-CM | POA: Diagnosis not present

## 2012-10-19 DIAGNOSIS — Z7902 Long term (current) use of antithrombotics/antiplatelets: Secondary | ICD-10-CM | POA: Diagnosis not present

## 2012-10-19 DIAGNOSIS — Z4502 Encounter for adjustment and management of automatic implantable cardiac defibrillator: Secondary | ICD-10-CM | POA: Diagnosis not present

## 2012-10-19 DIAGNOSIS — Z1509 Genetic susceptibility to other malignant neoplasm: Secondary | ICD-10-CM | POA: Diagnosis not present

## 2012-10-19 DIAGNOSIS — I509 Heart failure, unspecified: Secondary | ICD-10-CM | POA: Diagnosis not present

## 2012-10-19 MED ORDER — SODIUM CHLORIDE 0.9 % IR SOLN
80.0000 mg | Status: AC
  Filled 2012-10-19: qty 2

## 2012-10-19 MED ORDER — CEFAZOLIN SODIUM-DEXTROSE 2-3 GM-% IV SOLR
2.0000 g | INTRAVENOUS | Status: AC
  Filled 2012-10-19 (×2): qty 50

## 2012-10-19 MED ORDER — SODIUM CHLORIDE 0.9 % IV SOLN
INTRAVENOUS | Status: DC
  Administered 2012-10-20: 09:00:00 via INTRAVENOUS

## 2012-10-20 ENCOUNTER — Encounter (HOSPITAL_COMMUNITY)
Admission: RE | Disposition: A | Discharge status: Home / Self Care | Payer: Self-pay | Source: Ambulatory Visit | Attending: Internal Medicine

## 2012-10-20 ENCOUNTER — Ambulatory Visit (HOSPITAL_COMMUNITY)
Admission: RE | Admit: 2012-10-20 | Discharge: 2012-10-20 | Disposition: A | Discharge status: Home / Self Care | Payer: Medicare Other | Source: Ambulatory Visit | Attending: Internal Medicine | Admitting: Internal Medicine

## 2012-10-20 DIAGNOSIS — Z79899 Other long term (current) drug therapy: Secondary | ICD-10-CM | POA: Insufficient documentation

## 2012-10-20 DIAGNOSIS — Z85038 Personal history of other malignant neoplasm of large intestine: Secondary | ICD-10-CM | POA: Insufficient documentation

## 2012-10-20 DIAGNOSIS — I5020 Unspecified systolic (congestive) heart failure: Secondary | ICD-10-CM | POA: Diagnosis not present

## 2012-10-20 DIAGNOSIS — I472 Ventricular tachycardia, unspecified: Secondary | ICD-10-CM | POA: Insufficient documentation

## 2012-10-20 DIAGNOSIS — Z87891 Personal history of nicotine dependence: Secondary | ICD-10-CM | POA: Insufficient documentation

## 2012-10-20 DIAGNOSIS — Z4502 Encounter for adjustment and management of automatic implantable cardiac defibrillator: Secondary | ICD-10-CM | POA: Insufficient documentation

## 2012-10-20 DIAGNOSIS — I252 Old myocardial infarction: Secondary | ICD-10-CM | POA: Diagnosis not present

## 2012-10-20 DIAGNOSIS — I509 Heart failure, unspecified: Secondary | ICD-10-CM | POA: Insufficient documentation

## 2012-10-20 DIAGNOSIS — Z888 Allergy status to other drugs, medicaments and biological substances status: Secondary | ICD-10-CM | POA: Insufficient documentation

## 2012-10-20 DIAGNOSIS — I2589 Other forms of chronic ischemic heart disease: Secondary | ICD-10-CM | POA: Insufficient documentation

## 2012-10-20 DIAGNOSIS — Z1509 Genetic susceptibility to other malignant neoplasm: Secondary | ICD-10-CM | POA: Insufficient documentation

## 2012-10-20 DIAGNOSIS — I4729 Other ventricular tachycardia: Secondary | ICD-10-CM | POA: Insufficient documentation

## 2012-10-20 DIAGNOSIS — Z7982 Long term (current) use of aspirin: Secondary | ICD-10-CM | POA: Insufficient documentation

## 2012-10-20 DIAGNOSIS — Z7902 Long term (current) use of antithrombotics/antiplatelets: Secondary | ICD-10-CM | POA: Insufficient documentation

## 2012-10-20 DIAGNOSIS — N3010 Interstitial cystitis (chronic) without hematuria: Secondary | ICD-10-CM | POA: Insufficient documentation

## 2012-10-20 DIAGNOSIS — F4322 Adjustment disorder with anxiety: Secondary | ICD-10-CM | POA: Insufficient documentation

## 2012-10-20 HISTORY — PX: IMPLANTABLE CARDIOVERTER DEFIBRILLATOR (ICD) GENERATOR CHANGE: SHX5469

## 2012-10-20 LAB — SURGICAL PCR SCREEN
MRSA, PCR: NEGATIVE
Staphylococcus aureus: NEGATIVE

## 2012-10-20 SURGERY — ICD GENERATOR CHANGE
Anesthesia: LOCAL

## 2012-10-20 MED ORDER — FENTANYL CITRATE 0.05 MG/ML IJ SOLN
INTRAMUSCULAR | Status: AC
  Filled 2012-10-20: qty 2

## 2012-10-20 MED ORDER — ONDANSETRON HCL 4 MG/2ML IJ SOLN: 4.0000 mg | Freq: Four times a day (QID) | INTRAMUSCULAR | Status: DC | PRN

## 2012-10-20 MED ORDER — MUPIROCIN 2 % EX OINT: TOPICAL_OINTMENT | Freq: Once | CUTANEOUS | Status: AC

## 2012-10-20 MED ORDER — MIDAZOLAM HCL 5 MG/5ML IJ SOLN
INTRAMUSCULAR | Status: AC
  Filled 2012-10-20: qty 5

## 2012-10-20 MED ORDER — ACETAMINOPHEN 325 MG PO TABS: 325.0000 mg | ORAL_TABLET | ORAL | Status: DC | PRN

## 2012-10-20 MED ORDER — MUPIROCIN 2 % EX OINT
TOPICAL_OINTMENT | CUTANEOUS | Status: AC
  Administered 2012-10-20: 1 via NASAL
  Filled 2012-10-20: qty 22

## 2012-10-20 NOTE — CV Procedure (Signed)
EP Procedure Note  Procedure: ICD removal and insertion of a new device  Indication: ICM, s/p MI, EF 25%, greater than 5 years, no reversible causes, class 2 CHF, on maximal medical therapy (cannot take ACE inhibitor or ARB due to hypotension and dizziness)  Findings: After informed consent was obtained, patient prepped and draped in the usual fashion. IV fentanyl and versed were given for sedation. 30 cc of lidocaine was infiltrated over the old ICD incision. A 6 cm incision was carried out over this region and electrocautery utilized to dissect down to the ICD pocket. The device was explanted and the lead was evaluated. The R waves were 12.6 mV and the impedence was 700 ohms. Shocking impedence was 48 ohms. The threshold was 1.0 volt at 0.4 ms. The pocket was irrigated with anti-biotic irrigation and the incision was closed with 2-0 and 3-0 vicryl suture. Benzoin and steri-strips were placed on the incision, a pressure dressing was added and the patient was returned to his room in satisfactory condition.  Complications: none immediately  Gregg Taylor,M.D.

## 2012-10-20 NOTE — Progress Notes (Signed)
DR Ladona Ridgel NOTIFIED OF CLIENT ASKED WHEN TO RESUME PLAVIX AND PER DR Ladona Ridgel CLIENT ADVISED TO HOLD PLAVIX AND ASA UNTIL THURS AND CLIENT VOICED UNDERSTANDING

## 2012-10-20 NOTE — H&P (View-Only) (Signed)
HPI [place] returns today for followup. He is a very pleasant 67 year old man with an ischemic cardiomyopathy, chronic class I systolic heart failure, an ejection fraction of 30%, status post ICD implantation. In the interim, he has been stable. He denies chest pain, shortness of breath, or syncope. No ICD shocks. No peripheral edema or claudication. Allergies  Allergen Reactions  . Promethazine Hcl Other (See Comments)    Possible hallucinations     Current Outpatient Prescriptions  Medication Sig Dispense Refill  . aspirin 81 MG EC tablet Take 81 mg by mouth daily. 1 tabs by mouth daily      . atorvastatin (LIPITOR) 20 MG tablet Take 20 mg by mouth daily.        . clopidogrel (PLAVIX) 75 MG tablet Take 75 mg by mouth daily.        . Coenzyme Q10 (CO Q 10) 100 MG CAPS Take 2 capsules by mouth daily.       . lansoprazole (PREVACID) 30 MG capsule Take 1 capsule (30 mg total) by mouth daily.  90 capsule  1  . Multiple Vitamins-Minerals (CENTRUM SILVER PO) Take 1 tablet by mouth daily.      . Omega-3 Fatty Acids (FISH OIL) 1200 MG CAPS Take 1 capsule by mouth daily.      Marland Kitchen ALPRAZolam (XANAX) 0.5 MG tablet Take 0.5-1 mg by mouth 2 (two) times daily as needed for anxiety.      . metoprolol succinate (TOPROL-XL) 25 MG 24 hr tablet Take 12.5 mg by mouth daily.      . nitroGLYCERIN (NITROSTAT) 0.4 MG SL tablet Place 0.4 mg under the tongue every 5 (five) minutes as needed for chest pain.       No current facility-administered medications for this visit.     Past Medical History  Diagnosis Date  . Cardiomyopathy     EF about 30%  . CAD (coronary artery disease)     apical MI  . Dyslipidemia   . 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 ICD (implantable cardiac defibrillator) in place     ICD - Guidant  . Adjustment disorder with anxiety   . 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  Heredetary nonpolyposis  cancer syndrome (Lynch syndrome)--gets yearly colonoscopy (Dr. Matthias Hughs)  . History of gastric ulcer 10/2010    Bx benign.  . Interstitial cystitis     Dr. Earlene Plater  . History of diverticulitis of colon   . Urge incontinence 01/22/2012    Nocturia is his biggest issue   . Benign skin lesion     Chondrodermatitis nodularis (right superior helix) --Dr. Terri Piedra  . Lynch syndrome     At higher risk for certain cancers: colon and bladder are the primary ones.  . Peyronie's disease     [facility]    ROS:   All systems reviewed and negative except as noted in the HPI.   Past Surgical History  Procedure Laterality Date  . Cardiac defibrillator placement  5/07    Guidant ICD placement  . Cardiac defibrillator placement      [employer] - Remote - yes  . Abdominal surgery  2007    partial colectomy for colon cancer  . Colonoscopy      Normal 01/18/2012 (Dr. Matthias Hughs)  . Coronary stent placement  2006  . Tonsillectomy  1972     Family History  Problem Relation Age  of Onset  . Cancer Father     died at 110 (colon)  . Cancer Mother     died at 24 (breast ca)  . Cancer Brother     died at 71  . Cancer Sister     died at 41     History   Social History  . Marital Status: Married    Spouse Name: N/A    Number of Children: N/A  . Years of Education: N/A   Occupational History  . Not on file.   Social History Main Topics  . Smoking status: Former Smoker    Quit date: 04/10/1979  . Smokeless tobacco: Never Used  . Alcohol Use: Yes     Comment: wine on weekend  . Drug Use: No  . Sexually Active: Not on file   Other Topics Concern  . Not on file   Social History Narrative   Married, one son in early 39s.  Lives in [place], [place].   One year of college education but held high level/leadership position with [employer] .   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  Eats healthy diet.               BP 98/62  Pulse 59  Ht 5\' 7"  (1.702 m)  Wt 164 lb 8 oz (74.617 kg)  BMI 25.76 kg/m2  Physical Exam:  Well appearing 67 year old man, NAD HEENT: Unremarkable Neck:  No JVD, no thyromegally Back:  No CVA tenderness Lungs:  Clear with no wheezes, rales, or rhonchi. HEART:  Regular rate rhythm, no murmurs, no rubs, no clicks Abd:  soft, positive bowel sounds, no organomegally, no rebound, no guarding Ext:  2 plus pulses, no edema, no cyanosis, no clubbing Skin:  No rashes no nodules Neuro:  CN II through XII intact, motor grossly intact   DEVICE  Normal device function.  See PaceArt for details. He has reached elective replacement  Assess/Plan:

## 2012-10-20 NOTE — Interval H&P Note (Signed)
History and Physical Interval Note:  10/20/2012 11:55 AM  [place]  has presented today for surgery, with the diagnosis of End of life  The various methods of treatment have been discussed with the patient and family. After consideration of risks, benefits and other options for treatment, the patient has consented to  Procedure(s): ICD GENERATOR CHANGE (N/A) as a surgical intervention .  The patient's history has been reviewed, patient examined, no change in status, stable for surgery.  I have reviewed the patient's chart and labs.  Questions were answered to the patient's satisfaction.     Leonia Reeves.D.

## 2012-10-20 NOTE — Interval H&P Note (Signed)
ICD Criteria  Current LVEF (within 6 months):25%  NYHA Functional Classification: Class II  Heart Failure History:  Yes, Duration of heart failure since onset is > 9 months  Non-Ischemic Dilated Cardiomyopathy History:  No.  Atrial Fibrillation/Atrial Flutter:  No.  Ventricular Tachycardia History:  No.  Cardiac Arrest History:  No  History of Syndromes with Risk of Sudden Death:  No.  Previous ICD:  Yes, ICD Type:  Single, Reason for ICD:  Primary prevention.  25%  Electrophysiology Study: No.  Anticoagulation Therapy:  Patient is NOT on anticoagulation therapy.   Beta Blocker Therapy:  Yes.   Ace Inhibitor/ARB Therapy:  Yes. History and Physical Interval Note:  10/20/2012 1:24 PM  [place]  has presented today for surgery, with the diagnosis of End of life  The various methods of treatment have been discussed with the patient and family. After consideration of risks, benefits and other options for treatment, the patient has consented to  Procedure(s): ICD GENERATOR CHANGE (N/A) as a surgical intervention .  The patient's history has been reviewed, patient examined, no change in status, stable for surgery.  I have reviewed the patient's chart and labs.  Questions were answered to the patient's satisfaction.     [name]

## 2012-10-20 NOTE — Progress Notes (Signed)
Patient is not on an ACE inhibitor or ARB due to dizziness and hypotension. [place].D.

## 2012-10-21 ENCOUNTER — Telehealth: Payer: Self-pay | Admitting: Internal Medicine

## 2012-10-21 NOTE — Telephone Encounter (Signed)
Spoke w/pt and answered questions for wound care. Instructed pt to keep site dry and normally no shower x 1 week. Pt also had question about removing bandage. Instructed to leave steri strips in place but fine to remove bandage at this time. Pt is scheduled for wound check on 10-23-12 due to going out of town on 10-26-12.

## 2012-10-21 NOTE — Telephone Encounter (Signed)
New Prob     Pt has some questions regarding his wound care. Please call.

## 2012-10-23 ENCOUNTER — Ambulatory Visit (INDEPENDENT_AMBULATORY_CARE_PROVIDER_SITE_OTHER): Payer: Medicare Other | Admitting: *Deleted

## 2012-10-23 DIAGNOSIS — I509 Heart failure, unspecified: Secondary | ICD-10-CM

## 2012-10-23 LAB — ICD DEVICE OBSERVATION
DEV-0020ICD: NEGATIVE
DEVICE MODEL ICD: 123770
RV LEAD IMPEDENCE ICD: 753 Ohm
TZON-0003FASTVT: 300 ms
TZON-0003SLOWVT: 352.9 ms

## 2012-10-23 NOTE — Progress Notes (Signed)
Wound check-ICD in office. 

## 2012-11-11 ENCOUNTER — Ambulatory Visit: Payer: Self-pay | Admitting: Family Medicine

## 2012-11-11 ENCOUNTER — Telehealth: Payer: Self-pay | Admitting: Oncology

## 2012-11-11 NOTE — Telephone Encounter (Signed)
Faxed medical records to [facility]

## 2012-11-12 ENCOUNTER — Ambulatory Visit (INDEPENDENT_AMBULATORY_CARE_PROVIDER_SITE_OTHER): Payer: Medicare Other | Admitting: Family Medicine

## 2012-11-12 ENCOUNTER — Encounter: Payer: Self-pay | Admitting: Family Medicine

## 2012-11-12 VITALS — BP 124/69 | HR 64 | Temp 98.2°F | Resp 16 | Ht 67.5 in | Wt 159.0 lb

## 2012-11-12 DIAGNOSIS — F341 Dysthymic disorder: Secondary | ICD-10-CM | POA: Diagnosis not present

## 2012-11-12 DIAGNOSIS — N410 Acute prostatitis: Secondary | ICD-10-CM | POA: Diagnosis not present

## 2012-11-12 DIAGNOSIS — R350 Frequency of micturition: Secondary | ICD-10-CM | POA: Diagnosis not present

## 2012-11-12 DIAGNOSIS — F419 Anxiety disorder, unspecified: Secondary | ICD-10-CM

## 2012-11-12 MED ORDER — OXYBUTYNIN CHLORIDE ER 5 MG PO TB24: ORAL_TABLET | ORAL | Status: DC

## 2012-11-12 MED ORDER — VENLAFAXINE HCL ER 37.5 MG PO CP24: 37.5000 mg | ORAL_CAPSULE | Freq: Every day | ORAL | Status: DC

## 2012-11-12 MED ORDER — CIPROFLOXACIN HCL 500 MG PO TABS: ORAL_TABLET | ORAL | Status: DC

## 2012-11-12 NOTE — Progress Notes (Signed)
OFFICE NOTE  11/12/2012  CC:  Chief Complaint  Patient presents with  . Follow-up  . Dysuria    x 3-4 days     HPI: Patient is a 67 y.o. Caucasian male who is here for 1 mo f/u anxiety and depression. Started 37.5 mg qd effexor XR last visit.  Feels at least 50 % improved from a mood, anxiety, and energy standpoint.  He has no constipation since he takes senakot S qd and he is very happy with this.  Has long hx of bladder issues--has been dx'd with interstitial cystitis in the past.  Also has hx of UTI/Prostatitis in past. Last 3-4 days of prostatitis sx's: urinary frequency but no dysuria.  Has discomfort in prostate region in general lately, sometimes feels the fullness and discomfort from this in the low back/sacral region.  No fever.  NO nausea. He took AZO and this hasn't helped.  Pertinent PMH:  Past Medical History  Diagnosis Date  . Cardiomyopathy     EF about 30%  . CAD (coronary artery disease)     apical MI  . Dyslipidemia   . 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 ICD (implantable cardiac defibrillator) in place     ICD - Guidant  . Anxiety and depression   . 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 Matthias Hughs)  . History of gastric ulcer 10/2010    Bx benign.  . Interstitial cystitis     Dr. Earlene Plater  . History of diverticulitis of colon   . BPH (benign prostatic hypertrophy) 01/22/2012    Nocturia is his biggest issue   . Benign skin lesion     Chondrodermatitis nodularis (right superior helix) --Dr. Terri Piedra  . Lynch syndrome     At higher risk for certain cancers: colon and bladder are the primary ones.  . Peyronie's disease     [facility]   Past Surgical History  Procedure Laterality Date  . Cardiac defibrillator placement  5/07    Guidant ICD placement  . Cardiac defibrillator placement       [employer] - Remote - yes  . Abdominal surgery  2007    partial colectomy for colon cancer  . Colonoscopy      Normal 01/18/2012 (Dr. Matthias Hughs)  . Coronary stent placement  2006  . Tonsillectomy  1972    MEDS:  Outpatient Prescriptions Prior to Visit  Medication Sig Dispense Refill  . ALPRAZolam (XANAX) 0.5 MG tablet Take 0.5-1 mg by mouth 2 (two) times daily as needed for anxiety.      Marland Kitchen aspirin 81 MG EC tablet Take 81 mg by mouth daily. 1 tabs by mouth daily      . atorvastatin (LIPITOR) 20 MG tablet Take 20 mg by mouth daily.        . clopidogrel (PLAVIX) 75 MG tablet Take 1 tablet (75 mg total) by mouth daily.  90 tablet  4  . Coenzyme Q10 (CO Q 10) 100 MG CAPS Take 2 capsules by mouth daily.       Marland Kitchen CRANBERRY EXTRACT PO Take by mouth.      . lansoprazole (PREVACID) 30 MG capsule Take 1 capsule (30 mg total) by mouth daily.  90 capsule  1  . metoprolol succinate (TOPROL-XL) 25 MG 24 hr tablet Take 0.5 tablets (12.5 mg total) by mouth daily.  90 tablet  1  . Multiple Vitamins-Minerals (CENTRUM SILVER PO) Take 1 tablet by mouth daily.      . nitroGLYCERIN (NITROSTAT) 0.4 MG SL tablet Place 0.4 mg under the tongue every 5 (five) minutes as needed for chest pain.      . Omega-3 Fatty Acids (FISH OIL) 1200 MG CAPS Take 1 capsule by mouth daily.      Marland Kitchen venlafaxine XR (EFFEXOR-XR) 37.5 MG 24 hr capsule Take 1 capsule (37.5 mg total) by mouth daily.  30 capsule  1   No facility-administered medications prior to visit.    PE: Blood pressure 124/69, pulse 64, temperature 98.2 F (36.8 C), temperature source Temporal, resp. rate 16, height 5' 7.5" (1.715 m), weight 159 lb (72.122 kg), SpO2 97.00%. Gen: Alert, well appearing.  Patient is oriented to person, place, time, and situation. AFFECT: pleasant, lucid thought and speech. DRE: normal rectal tone, no mass.  Prostate smooth, normal size, without nodule/induration or tenderness.  He does say that palpation of the prostate gland  causes an increased pressure sensation.  IMPRESSION AND PLAN:  Anxiety and depression Improved. Continue Effexor XR 25mg  qd.  Prostatitis, acute Superimposed on chronic urge incontinence. Send urine for c/s. Cipro 500mg  bid x 14d. Short term ditropan XL 5mg  qd x 14d.   An After Visit Summary was printed and given to the patient.  FOLLOW UP: 15mo f/u dep/anx

## 2012-11-12 NOTE — Addendum Note (Signed)
Addended by: Jeoffrey Massed on: 11/12/2012 09:34 AM   Modules accepted: Level of Service

## 2012-11-12 NOTE — Assessment & Plan Note (Signed)
Restart effexor XR 37.5 mg caps, 1 qd.  Therapeutic expectations and side effect profile of medication discussed today.  Patient's questions answered.

## 2012-11-14 LAB — URINE CULTURE: Colony Count: NO GROWTH

## 2012-11-20 ENCOUNTER — Ambulatory Visit: Payer: Medicare Other | Admitting: Family Medicine

## 2012-11-28 ENCOUNTER — Telehealth: Payer: Self-pay | Admitting: Family Medicine

## 2012-11-28 MED ORDER — VENLAFAXINE HCL ER 37.5 MG PO CP24: 37.5000 mg | ORAL_CAPSULE | Freq: Every day | ORAL | Status: DC

## 2012-11-28 MED ORDER — CLOPIDOGREL BISULFATE 75 MG PO TABS: 75.0000 mg | ORAL_TABLET | Freq: Every day | ORAL | Status: DC

## 2012-11-28 NOTE — Telephone Encounter (Signed)
Patient called requesting plavix and effexor refills to be sent to mail order pharmacy.

## 2012-12-09 ENCOUNTER — Encounter: Payer: Self-pay | Admitting: Internal Medicine

## 2012-12-11 ENCOUNTER — Other Ambulatory Visit: Payer: Self-pay | Admitting: Family Medicine

## 2012-12-11 MED ORDER — ATORVASTATIN CALCIUM 20 MG PO TABS: 20.0000 mg | ORAL_TABLET | Freq: Every day | ORAL | Status: DC

## 2012-12-25 DIAGNOSIS — N410 Acute prostatitis: Secondary | ICD-10-CM | POA: Insufficient documentation

## 2012-12-25 NOTE — Assessment & Plan Note (Signed)
Superimposed on chronic urge incontinence. Send urine for c/s. Cipro 500mg  bid x 14d. Short term ditropan XL 5mg  qd x 14d.

## 2012-12-25 NOTE — Assessment & Plan Note (Addendum)
Improved. Continue Effexor XR 37.5 mg qd.

## 2013-01-02 DIAGNOSIS — Z23 Encounter for immunization: Secondary | ICD-10-CM | POA: Diagnosis not present

## 2013-01-19 ENCOUNTER — Ambulatory Visit (INDEPENDENT_AMBULATORY_CARE_PROVIDER_SITE_OTHER): Payer: Medicare Other | Admitting: Family Medicine

## 2013-01-19 ENCOUNTER — Encounter: Payer: Self-pay | Admitting: Family Medicine

## 2013-01-19 VITALS — BP 109/70 | HR 80 | Temp 98.6°F | Resp 18 | Ht 67.5 in | Wt 159.0 lb

## 2013-01-19 DIAGNOSIS — J209 Acute bronchitis, unspecified: Secondary | ICD-10-CM

## 2013-01-19 DIAGNOSIS — N3941 Urge incontinence: Secondary | ICD-10-CM | POA: Diagnosis not present

## 2013-01-19 DIAGNOSIS — F329 Major depressive disorder, single episode, unspecified: Secondary | ICD-10-CM | POA: Diagnosis not present

## 2013-01-19 DIAGNOSIS — Z23 Encounter for immunization: Secondary | ICD-10-CM | POA: Diagnosis not present

## 2013-01-19 DIAGNOSIS — F32A Depression, unspecified: Secondary | ICD-10-CM

## 2013-01-19 DIAGNOSIS — Z0000 Encounter for general adult medical examination without abnormal findings: Secondary | ICD-10-CM

## 2013-01-19 MED ORDER — TETANUS-DIPHTH-ACELL PERTUSSIS 5-2.5-18.5 LF-MCG/0.5 IM SUSP
0.5000 mL | Freq: Once | INTRAMUSCULAR | Status: AC
  Administered 2013-01-19: 0.5 mL via INTRAMUSCULAR

## 2013-01-19 MED ORDER — ALBUTEROL SULFATE HFA 108 (90 BASE) MCG/ACT IN AERS: 2.0000 | INHALATION_SPRAY | RESPIRATORY_TRACT | Status: DC | PRN

## 2013-01-19 MED ORDER — METHYLPREDNISOLONE ACETATE 40 MG/ML IJ SUSP
40.0000 mg | Freq: Once | INTRAMUSCULAR | Status: AC
  Administered 2013-01-19: 40 mg via INTRA_ARTICULAR

## 2013-01-19 NOTE — Progress Notes (Addendum)
OFFICE NOTE  01/19/2013  CC:  Chief Complaint  Patient presents with  . Fever    low grade  . Cough    getting worse  . Wheezing     HPI: Patient is a 67 y.o. Caucasian male who is here for respiratory symptoms. nset aAbout 9 days ago of runny nose/congestion, hoarseness, PND, some cough and tightness in chest.  Throat hurts a bit when he coughs. Tm about 100--felt subjective f/c and some body aches early on but none lately.  Not much appetite lately, but no n/v.  No diarrhea. Upper resp sx's improving but cough/chest tightness continues.  Cough is nonproductive.  Denies SOB or chest pains.  UPDATE: mood and anxiety MUCH improved since getting back on venlafaxine 37.5mg  qd. Has some constipation, as he expected, from this med and taking 1 senakot per day helps control this well.  He also happily reports he has used oxybutynin on a few occasions now to help his urinary urgency/frequency and this has helped him quite a bit.  He plans on using on a prn basis in the future.  Pertinent PMH:  Past Medical History  Diagnosis Date  . Cardiomyopathy     EF about 30%  . CAD (coronary artery disease)     apical MI  . Dyslipidemia   . 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 ICD (implantable cardiac defibrillator) in place     ICD - Guidant  . Anxiety and depression   . 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 Matthias Hughs)  . History of gastric ulcer 10/2010    Bx benign.  . Interstitial cystitis     Dr. Earlene Plater  . History of diverticulitis of colon   . BPH (benign prostatic hypertrophy) 01/22/2012    Nocturia is his biggest issue   . Benign skin lesion     Chondrodermatitis nodularis (right superior helix) --Dr. Terri Piedra  . Lynch syndrome     At higher risk for certain cancers: colon and bladder are the primary ones.  .  Peyronie's disease     [facility]   Past surgical, social, and family history reviewed and no changes noted since last office visit.  MEDS:  Outpatient Prescriptions Prior to Visit  Medication Sig Dispense Refill  . ALPRAZolam (XANAX) 0.5 MG tablet Take 0.5-1 mg by mouth 2 (two) times daily as needed for anxiety.      Marland Kitchen aspirin 81 MG EC tablet Take 81 mg by mouth daily. 1 tabs by mouth daily      . atorvastatin (LIPITOR) 20 MG tablet Take 1 tablet (20 mg total) by mouth daily.  90 tablet  1  . clopidogrel (PLAVIX) 75 MG tablet Take 1 tablet (75 mg total) by mouth daily.  90 tablet  4  . Coenzyme Q10 (CO Q 10) 100 MG CAPS Take 2 capsules by mouth daily.       . lansoprazole (PREVACID) 30 MG capsule Take 1 capsule (30 mg total) by mouth daily.  90 capsule  1  . metoprolol succinate (TOPROL-XL) 25 MG 24 hr tablet Take 0.5 tablets (12.5 mg total) by mouth daily.  90 tablet  1  . Multiple Vitamins-Minerals (CENTRUM SILVER PO) Take 1 tablet by mouth daily.      . Omega-3 Fatty Acids (FISH OIL) 1200 MG CAPS Take 1 capsule by mouth daily.      Marland Kitchen  oxybutynin (DITROPAN-XL) 5 MG 24 hr tablet 1 tab po qAM for urinary emptying  14 tablet  1  . venlafaxine XR (EFFEXOR-XR) 37.5 MG 24 hr capsule Take 1 capsule (37.5 mg total) by mouth daily.  90 capsule  1  . CRANBERRY EXTRACT PO Take by mouth.      . nitroGLYCERIN (NITROSTAT) 0.4 MG SL tablet Place 0.4 mg under the tongue every 5 (five) minutes as needed for chest pain.      . ciprofloxacin (CIPRO) 500 MG tablet 1 tab po bid x 14d  28 tablet  0   No facility-administered medications prior to visit.    PE: Blood pressure 109/70, pulse 80, temperature 98.6 F (37 C), temperature source Temporal, resp. rate 18, height 5' 7.5" (1.715 m), weight 159 lb (72.122 kg), SpO2 97.00%. VS: noted--normal. Gen: alert, NAD, NONTOXIC APPEARING. HEENT: eyes without injection, drainage, or swelling.  Ears: EACs clear, TMs with normal light reflex and landmarks.   Nose: Clear rhinorrhea, with some dried, crusty exudate adherent to mildly injected mucosa.  No purulent d/c.  No paranasal sinus TTP.  No facial swelling.  Throat and mouth without focal lesion.  No pharyngial swelling, erythema, or exudate.   Neck: supple, no LAD.   LUNGS: CTA bilat, nonlabored resps.  Trace insp rhonchi in both bases, trace exp wheeze but his aeration is excellent and he has no prolongation of exp phase.  He has some post-exhalation coughing. CV: RRR, no m/r/g. EXT: no c/c/e SKIN: no rash  LAB: none today  IMPRESSION AND PLAN:  Acute bronchitis Mild RAD. Depo-medrol 40mg  IM today in office. Ventolin HFA rx'd, inhaler education done by nurse in office today. Viral nature of this illness discussed.   Depression The current medical regimen is effective;  continue present plan and medications. Continue senakot daily to help the constipation assoc with his venlafaxine use.   Urge incont: improved with prn use of oxybutynin.  An After Visit Summary was printed and given to the patient.   FOLLOW UP: 30mo f/u anx/dep

## 2013-01-19 NOTE — Assessment & Plan Note (Addendum)
Mild RAD. Depo-medrol 40mg  IM today in office. Ventolin HFA rx'd, inhaler education done by nurse in office today. Viral nature of this illness discussed.  Recommended mucinex DM OTC prn.

## 2013-01-19 NOTE — Assessment & Plan Note (Signed)
The current medical regimen is effective;  continue present plan and medications. Continue senakot daily to help the constipation assoc with his venlafaxine use.

## 2013-01-19 NOTE — Assessment & Plan Note (Signed)
Tdap given today. He is otherwise UTD.

## 2013-01-23 ENCOUNTER — Encounter: Payer: Self-pay | Admitting: Internal Medicine

## 2013-01-23 ENCOUNTER — Ambulatory Visit (INDEPENDENT_AMBULATORY_CARE_PROVIDER_SITE_OTHER): Payer: Medicare Other | Admitting: Internal Medicine

## 2013-01-23 VITALS — BP 104/64 | HR 62 | Ht 67.0 in | Wt 160.8 lb

## 2013-01-23 DIAGNOSIS — I2510 Atherosclerotic heart disease of native coronary artery without angina pectoris: Secondary | ICD-10-CM

## 2013-01-23 DIAGNOSIS — Z95810 Presence of automatic (implantable) cardiac defibrillator: Secondary | ICD-10-CM | POA: Diagnosis not present

## 2013-01-23 DIAGNOSIS — I428 Other cardiomyopathies: Secondary | ICD-10-CM

## 2013-01-23 LAB — ICD DEVICE OBSERVATION
DEVICE MODEL ICD: 123770
HV IMPEDENCE: 59 Ohm
RV LEAD AMPLITUDE: 15.1 mv
RV LEAD IMPEDENCE ICD: 779 Ohm
TZON-0003FASTVT: 300 ms
TZON-0003SLOWVT: 352.9 ms
VENTRICULAR PACING ICD: 1 pct

## 2013-01-23 NOTE — Assessment & Plan Note (Signed)
He remains active, and denies anginal symptoms. He'll continue his current medical therapy.

## 2013-01-23 NOTE — Progress Notes (Signed)
HPI [name] returns today for followup. He is a very pleasant 67 year old man with an ischemic cardiomyopathy, chronic class I systolic heart failure, an ejection fraction of 30%, status post ICD implantation. In the interim, he has been stable. He denies chest pain, shortness of breath, or syncope. No ICD shocks. No peripheral edema or claudication. Allergies  Allergen Reactions  . Promethazine Hcl Other (See Comments)    Possible hallucinations     Current Outpatient Prescriptions  Medication Sig Dispense Refill  . albuterol (VENTOLIN HFA) 108 (90 BASE) MCG/ACT inhaler Inhale 2 puffs into the lungs every 4 (four) hours as needed for wheezing.  1 Inhaler  1  . ALPRAZolam (XANAX) 0.5 MG tablet Take 0.5-1 mg by mouth 2 (two) times daily as needed for anxiety.      Marland Kitchen aspirin 81 MG EC tablet Take 81 mg by mouth daily. 1 tabs by mouth daily      . atorvastatin (LIPITOR) 20 MG tablet Take 1 tablet (20 mg total) by mouth daily.  90 tablet  1  . clopidogrel (PLAVIX) 75 MG tablet Take 1 tablet (75 mg total) by mouth daily.  90 tablet  4  . Coenzyme Q10 (CO Q 10) 100 MG CAPS Take 2 capsules by mouth daily.       . lansoprazole (PREVACID) 30 MG capsule Take 1 capsule (30 mg total) by mouth daily.  90 capsule  1  . metoprolol succinate (TOPROL-XL) 25 MG 24 hr tablet Take 0.5 tablets (12.5 mg total) by mouth daily.  90 tablet  1  . Multiple Vitamins-Minerals (CENTRUM SILVER PO) Take 1 tablet by mouth daily.      . nitroGLYCERIN (NITROSTAT) 0.4 MG SL tablet Place 0.4 mg under the tongue every 5 (five) minutes as needed for chest pain.      . Omega-3 Fatty Acids (FISH OIL) 1200 MG CAPS Take 1 capsule by mouth daily.      Marland Kitchen oxybutynin (DITROPAN-XL) 5 MG 24 hr tablet 1 tab po qAM for urinary emptying  14 tablet  1  . venlafaxine XR (EFFEXOR-XR) 37.5 MG 24 hr capsule Take 1 capsule (37.5 mg total) by mouth daily.  90 capsule  1   No current facility-administered medications for this visit.     Past  Medical History  Diagnosis Date  . Cardiomyopathy     EF about 30%  . CAD (coronary artery disease)     apical MI  . Dyslipidemia   . 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 ICD (implantable cardiac defibrillator) in place     ICD - Guidant  . Anxiety and depression   . 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 Matthias Hughs)  . History of gastric ulcer 10/2010    Bx benign.  . Interstitial cystitis     Dr. Earlene Plater  . History of diverticulitis of colon   . BPH (benign prostatic hypertrophy) 01/22/2012    Nocturia is his biggest issue   . Benign skin lesion     Chondrodermatitis nodularis (right superior helix) --Dr. Terri Piedra  . Lynch syndrome     At higher risk for certain cancers: colon and bladder are the primary ones.  . Peyronie's disease     [facility]    ROS:   All systems reviewed and negative except as noted in the HPI.   Past  Surgical History  Procedure Laterality Date  . Cardiac defibrillator placement  5/07    Guidant ICD placement  . Cardiac defibrillator placement      [employer] - Remote - yes  . Abdominal surgery  2007    partial colectomy for colon cancer  . Colonoscopy      Normal 01/18/2012 (Dr. Matthias Hughs)  . Coronary stent placement  2006  . Tonsillectomy  1972     Family History  Problem Relation Age of Onset  . Cancer Father     died at 107 (colon)  . Cancer Mother     died at 4 (breast ca)  . Cancer Brother     died at 27  . Cancer Sister     died at 56     History   Social History  . Marital Status: Married    Spouse Name: N/A    Number of Children: N/A  . Years of Education: N/A   Occupational History  . Not on file.   Social History Main Topics  . Smoking status: Former Smoker    Quit date: 04/10/1979  . Smokeless tobacco: Never Used  . Alcohol Use: Yes      Comment: wine on weekend  . Drug Use: No  . Sexual Activity: Not on file   Other Topics Concern  . Not on file   Social History Narrative   Married, one son in early 18s.  Lives in [place], [place].   One year of college education but held high level/leadership position with [employer] .   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  Eats healthy diet.              BP 104/64  Pulse 62  Ht 5\' 7"  (1.702 m)  Wt 160 lb 12.8 oz (72.938 kg)  BMI 25.18 kg/m2  Physical Exam:  Well appearing 67 year old man, NAD HEENT: Unremarkable Neck:  No JVD, no thyromegally Back:  No CVA tenderness Lungs:  Clear with no wheezes, rales, or rhonchi. HEART:  Regular rate rhythm, no murmurs, no rubs, no clicks Abd:  soft, positive bowel sounds, no organomegally, no rebound, no guarding Ext:  2 plus pulses, no edema, no cyanosis, no clubbing Skin:  No rashes no nodules Neuro:  CN II through XII intact, motor grossly intact   DEVICE  Normal device function.  See PaceArt for details. He has reached elective replacement  Assess/Plan:

## 2013-01-23 NOTE — Patient Instructions (Signed)
Your physician wants you to follow-up in: 10/2013 with Dr Court Joy will receive a reminder letter in the mail two months in advance. If you don't receive a letter, please call our office to schedule the follow-up appointment.  Remote monitoring is used to monitor your Pacemaker or ICD from home. This monitoring reduces the number of office visits required to check your device to one time per year. It allows [place] to keep an eye on the functioning of your device to ensure it is working properly. You are scheduled for a device check from home on 04/27/13. You may send your transmission at any time that day. If you have a wireless device, the transmission will be sent automatically. After your physician reviews your transmission, you will receive a postcard with your next transmission date.

## 2013-01-23 NOTE — Assessment & Plan Note (Signed)
His device is working normally. We'll plan to recheck in several months. 

## 2013-02-04 ENCOUNTER — Encounter: Payer: Self-pay | Admitting: Family Medicine

## 2013-02-04 ENCOUNTER — Encounter (HOSPITAL_COMMUNITY): Payer: Self-pay | Admitting: Emergency Medicine

## 2013-02-04 ENCOUNTER — Emergency Department (HOSPITAL_COMMUNITY): Payer: Medicare Other

## 2013-02-04 ENCOUNTER — Ambulatory Visit (INDEPENDENT_AMBULATORY_CARE_PROVIDER_SITE_OTHER)
Admission: RE | Admit: 2013-02-04 | Discharge: 2013-02-04 | Disposition: A | Discharge status: Home / Self Care | Payer: Medicare Other | Source: Ambulatory Visit | Attending: Family Medicine | Admitting: Family Medicine

## 2013-02-04 ENCOUNTER — Ambulatory Visit (INDEPENDENT_AMBULATORY_CARE_PROVIDER_SITE_OTHER): Payer: Medicare Other | Admitting: Family Medicine

## 2013-02-04 ENCOUNTER — Emergency Department (HOSPITAL_COMMUNITY)
Admission: EM | Admit: 2013-02-04 | Discharge: 2013-02-04 | Disposition: A | Discharge status: Home / Self Care | Payer: Medicare Other | Attending: Emergency Medicine | Admitting: Emergency Medicine

## 2013-02-04 VITALS — BP 101/67 | HR 66 | Temp 98.2°F | Resp 18 | Ht 67.5 in | Wt 159.0 lb

## 2013-02-04 DIAGNOSIS — Z79899 Other long term (current) drug therapy: Secondary | ICD-10-CM | POA: Insufficient documentation

## 2013-02-04 DIAGNOSIS — M549 Dorsalgia, unspecified: Secondary | ICD-10-CM

## 2013-02-04 DIAGNOSIS — F411 Generalized anxiety disorder: Secondary | ICD-10-CM | POA: Diagnosis not present

## 2013-02-04 DIAGNOSIS — R079 Chest pain, unspecified: Secondary | ICD-10-CM

## 2013-02-04 DIAGNOSIS — R791 Abnormal coagulation profile: Secondary | ICD-10-CM | POA: Diagnosis not present

## 2013-02-04 DIAGNOSIS — R071 Chest pain on breathing: Secondary | ICD-10-CM | POA: Diagnosis not present

## 2013-02-04 DIAGNOSIS — F329 Major depressive disorder, single episode, unspecified: Secondary | ICD-10-CM | POA: Diagnosis not present

## 2013-02-04 DIAGNOSIS — Z8719 Personal history of other diseases of the digestive system: Secondary | ICD-10-CM | POA: Insufficient documentation

## 2013-02-04 DIAGNOSIS — Z87891 Personal history of nicotine dependence: Secondary | ICD-10-CM | POA: Diagnosis not present

## 2013-02-04 DIAGNOSIS — R7989 Other specified abnormal findings of blood chemistry: Secondary | ICD-10-CM

## 2013-02-04 DIAGNOSIS — Z7982 Long term (current) use of aspirin: Secondary | ICD-10-CM | POA: Diagnosis not present

## 2013-02-04 DIAGNOSIS — I2510 Atherosclerotic heart disease of native coronary artery without angina pectoris: Secondary | ICD-10-CM | POA: Insufficient documentation

## 2013-02-04 DIAGNOSIS — F3289 Other specified depressive episodes: Secondary | ICD-10-CM | POA: Insufficient documentation

## 2013-02-04 DIAGNOSIS — Z872 Personal history of diseases of the skin and subcutaneous tissue: Secondary | ICD-10-CM | POA: Insufficient documentation

## 2013-02-04 DIAGNOSIS — E785 Hyperlipidemia, unspecified: Secondary | ICD-10-CM | POA: Insufficient documentation

## 2013-02-04 DIAGNOSIS — R0789 Other chest pain: Secondary | ICD-10-CM | POA: Insufficient documentation

## 2013-02-04 DIAGNOSIS — Z85038 Personal history of other malignant neoplasm of large intestine: Secondary | ICD-10-CM | POA: Diagnosis not present

## 2013-02-04 DIAGNOSIS — Z95810 Presence of automatic (implantable) cardiac defibrillator: Secondary | ICD-10-CM | POA: Insufficient documentation

## 2013-02-04 DIAGNOSIS — Z9861 Coronary angioplasty status: Secondary | ICD-10-CM | POA: Diagnosis not present

## 2013-02-04 DIAGNOSIS — Z87448 Personal history of other diseases of urinary system: Secondary | ICD-10-CM | POA: Diagnosis not present

## 2013-02-04 DIAGNOSIS — C189 Malignant neoplasm of colon, unspecified: Secondary | ICD-10-CM | POA: Diagnosis not present

## 2013-02-04 LAB — CBC WITH DIFFERENTIAL/PLATELET
Basophils Relative: 1 % (ref 0–1)
Eosinophils Absolute: 0.2 10*3/uL (ref 0.0–0.7)
MCH: 27.4 pg (ref 26.0–34.0)
MCHC: 33.2 g/dL (ref 30.0–36.0)
Neutrophils Relative %: 57 % (ref 43–77)
Platelets: 289 10*3/uL (ref 150–400)
RBC: 4.71 MIL/uL (ref 4.22–5.81)

## 2013-02-04 LAB — TROPONIN I: Troponin I: <0.3 ng/mL (ref <0.30)

## 2013-02-04 LAB — BASIC METABOLIC PANEL
BUN: 24 mg/dL — ABNORMAL HIGH (ref 6–23)
Calcium: 9.4 mg/dL (ref 8.4–10.5)
GFR calc Af Amer: >90 mL/min (ref >90)
GFR calc non Af Amer: 83 mL/min — ABNORMAL LOW (ref >90)
Glucose, Bld: 91 mg/dL (ref 70–99)
Sodium: 137 mEq/L (ref 135–145)

## 2013-02-04 MED ORDER — IOHEXOL 350 MG/ML SOLN
100.0000 mL | Freq: Once | INTRAVENOUS | Status: AC | PRN
  Administered 2013-02-04: 100 mL via INTRAVENOUS

## 2013-02-04 NOTE — ED Provider Notes (Signed)
CSN: [id number]     Arrival date & time 02/04/13  1734 History   First MD Initiated Contact with Patient 02/04/13 1747     Chief Complaint  Patient presents with  . Chest Pain  . R/O PE    (Consider location/radiation/quality/duration/timing/severity/associated sxs/prior Treatment) HPI.... patient has had a mild tightness in his anterior chest with radiation to back for 3 days. No associated dyspnea, diaphoresis, nausea. He went to his primary care Dr. today who ordered a d-dimer. It was slightly elevated. His doctor recommended he go to the emergency department for a CT angio of his chest to rule out pulmonary embolus.  Patient is able to exercise without worsening symptoms.  He has known heart disease including cardiomyopathy, coronary artery disease, ICD implantation.  Severity is mild to moderate   Past Medical History  Diagnosis Date  . Cardiomyopathy     EF about 30%  . CAD (coronary artery disease)     apical MI  . Dyslipidemia   . 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 ICD (implantable cardiac defibrillator) in place     ICD - Guidant  . Anxiety and depression   . 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 Matthias Hughs)  . History of gastric ulcer 10/2010    Bx benign.  . Interstitial cystitis     Dr. Earlene Plater  . History of diverticulitis of colon   . BPH (benign prostatic hypertrophy) 01/22/2012    Nocturia is his biggest issue   . Benign skin lesion     Chondrodermatitis nodularis (right superior helix) --Dr. Terri Piedra  . Lynch syndrome     At higher risk for certain cancers: colon and bladder are the primary ones.  . Peyronie's disease     [facility]   Past Surgical History  Procedure Laterality Date  . Cardiac defibrillator placement  5/07    Guidant ICD placement  . Cardiac defibrillator placement       [employer] - Remote - yes  . Abdominal surgery  2007    partial colectomy for colon cancer  . Colonoscopy      Normal 01/18/2012 (Dr. Matthias Hughs)  . Coronary stent placement  2006  . Tonsillectomy  1972   Family History  Problem Relation Age of Onset  . Cancer Father     died at 61 (colon)  . Cancer Mother     died at 61 (breast ca)  . Cancer Brother     died at 74  . Cancer Sister     died at 56   History  Substance Use Topics  . Smoking status: Former Smoker    Quit date: 04/10/1979  . Smokeless tobacco: Never Used  . Alcohol Use: Yes     Comment: wine on weekend    Review of Systems  All other systems reviewed and are negative.    Allergies  Promethazine hcl  Home Medications   Current Outpatient Rx  Name  Route  Sig  Dispense  Refill  . albuterol (VENTOLIN HFA) 108 (90 BASE) MCG/ACT inhaler   Inhalation   Inhale 2 puffs into the lungs every 4 (four) hours as needed for wheezing.   1 Inhaler   1   . ALPRAZolam (XANAX) 0.5 MG tablet   Oral   Take 0.5-1 mg by mouth 2 (two) times daily as needed for  anxiety.         Marland Kitchen aspirin 81 MG EC tablet   Oral   Take 81 mg by mouth daily. 1 tabs by mouth daily         . atorvastatin (LIPITOR) 20 MG tablet   Oral   Take 1 tablet (20 mg total) by mouth daily.   90 tablet   1   . clopidogrel (PLAVIX) 75 MG tablet   Oral   Take 1 tablet (75 mg total) by mouth daily.   90 tablet   4   . Coenzyme Q10 (CO Q 10) 100 MG CAPS   Oral   Take 2 capsules by mouth daily.          . lansoprazole (PREVACID) 30 MG capsule   Oral   Take 1 capsule (30 mg total) by mouth daily.   90 capsule   1   . metoprolol succinate (TOPROL-XL) 25 MG 24 hr tablet   Oral   Take 0.5 tablets (12.5 mg total) by mouth daily.   90 tablet   1   . Multiple Vitamins-Minerals (CENTRUM SILVER PO)   Oral   Take 1 tablet by mouth daily.         . nitroGLYCERIN (NITROSTAT) 0.4 MG SL tablet   Sublingual   Place 0.4 mg under the  tongue every 5 (five) minutes as needed for chest pain.         . Omega-3 Fatty Acids (FISH OIL) 1200 MG CAPS   Oral   Take 1 capsule by mouth daily.         Marland Kitchen oxybutynin (DITROPAN-XL) 5 MG 24 hr tablet      1 tab po qAM for urinary emptying   14 tablet   1   . venlafaxine XR (EFFEXOR-XR) 37.5 MG 24 hr capsule   Oral   Take 1 capsule (37.5 mg total) by mouth daily.   90 capsule   1    BP 131/65  Pulse 62  Temp(Src) 97.6 F (36.4 C) (Oral)  Resp 18  Ht 5\' 7"  (1.702 m)  Wt 159 lb (72.122 kg)  BMI 24.9 kg/m2  SpO2 99% Physical Exam  Nursing note and vitals reviewed. Constitutional: He is oriented to person, place, and time. He appears well-developed and well-nourished.  Appears in no acute distress  HENT:  Head: Normocephalic and atraumatic.  Eyes: Conjunctivae and EOM are normal. Pupils are equal, round, and reactive to light.  Neck: Normal range of motion. Neck supple.  Cardiovascular: Normal rate, regular rhythm and normal heart sounds.   Pulmonary/Chest: Effort normal and breath sounds normal.  Abdominal: Soft. Bowel sounds are normal.  Musculoskeletal: Normal range of motion.  Neurological: He is alert and oriented to person, place, and time.  Skin: Skin is warm and dry.  Psychiatric: He has a normal mood and affect.    ED Course  Procedures (including critical care time) Labs Review Labs Reviewed - No data to display Imaging Review Dg Chest 2 View  02/04/2013   CLINICAL DATA:  Chest and back pain  EXAM: CHEST  2 VIEW  COMPARISON:  Chest x-ray of 03/12/2010  FINDINGS: There is some fullness of the perihilar vasculature which may indicate fluid overload. No infiltrate or effusion is seen. There is mild cardiomegaly present which is stable and an AICD lead remains. The lungs are slightly hyperaerated. No bony abnormality is seen.  IMPRESSION: 1. Mild cardiomegaly. Question mild pulmonary vascular congestion. 2. Cardiac defibrillator remains.  Electronically  Signed   By: Dwyane Dee M.D.   On: 02/04/2013 15:44   Ct Angio Chest Pe W/cm &/or Wo Cm  02/04/2013   CLINICAL DATA:  Chest pain, coronary stents. Colon carcinoma.  EXAM: CT ANGIOGRAPHY CHEST WITH CONTRAST  TECHNIQUE: Multidetector CT imaging of the chest was performed using the standard protocol during bolus administration of intravenous contrast. Multiplanar CT image reconstructions including MIPs were obtained to evaluate the vascular anatomy.  CONTRAST:  OMNIPAQUE IOHEXOL 350 MG/ML SOLN  COMPARISON:  09/09/2006  FINDINGS: Satisfactory opacification of pulmonary arteries noted, and there is no evidence of pulmonary emboli. Adequate contrast opacification of the thoracic aorta with no evidence of dissection, aneurysm, or stenosis. There is classic 3-vessel brachiocephalic arch anatomy without proximal stenosis. Subcentimeter prevascular lymph nodes. No hilar adenopathy. Coronary calcifications. Left subclavian AICD extends towards the right ventricular apex. Focal areas of thickening along the minor fissure stable. No new nodule or focal infiltrate. Visualized portions of upper abdomen unremarkable. Spondylitic changes in the mid and lower thoracic spine.  Review of the MIP images confirms the above findings.  IMPRESSION: 1. Negative for acute PE or thoracic aortic dissection. 2. Atherosclerosis, including coronary artery disease. Please note that although the presence of coronary artery calcium documents the presence of coronary artery disease, the severity of this disease and any potential stenosis cannot be assessed on this non-gated CT examination. Assessment for potential risk factor modification, dietary therapy or pharmacologic therapy may be warranted, if clinically indicated.   Electronically Signed   By: Oley Balm M.D.   On: 02/04/2013 20:25    EKG Interpretation   None        Date: 02/04/2013  Rate: 62  Rhythm: normal sinus rhythm  QRS Axis: left  Intervals: normal  ST/T  Wave abnormalities: normal  Conduction Disutrbances: none  Narrative Interpretation: unremarkable      MDM  No diagnosis found. CT angiogram of chest was negative for pulmonary embolism. Troponin and EKG negative. Discussed with cardiologist on call in [place]. He will arrange followup for Thursday or Friday of this week. I offered admission to the patient but he is comfortable with an outpatient visit    Donnetta Hutching, MD 02/04/13 2137

## 2013-02-04 NOTE — ED Notes (Signed)
Patient given discharge instruction, verbalized understand. IV removed, band aid applied. Patient ambulatory out of the department.  

## 2013-02-04 NOTE — ED Notes (Signed)
MD at bedside. 

## 2013-02-04 NOTE — ED Notes (Signed)
Pt reports has had chest pain and pain in back for past 3 days.  Says pain is worse in his back on left side.  Pt went to PCP today and had blood work.  Was told by his pcp to come here to r/o PE because D dimer was elevated.

## 2013-02-04 NOTE — Progress Notes (Signed)
OFFICE NOTE  02/04/2013  CC:  Chief Complaint  Patient presents with  . Back Pain     HPI: Patient is a 67 y.o. Caucasian male who is here for back and chest pains/pressure.  Got over recent resp illness and felt normal x 2d. Then about 4 d/a he started having a nearly constant pressure in his central back described as "nagging discomfort", and an intermittent pain in the upper sternal area that goes through to the back (1-2/10, intensity).  Also left pectoral/axillary discomfort at times. He has still been able to do his treadmill w/out problem.   No fevers but felt flushed some on at least one occasion recently. Nose runny a bit in morning.  No significant coughing.  No SOB. Nausea the first day.  Some clearing of the throat more last few days.  Denies feeling heartburn or regurg. He can identify nothing that makes his sx's worse and nothing that alleviates it. He has taken some tylenol and it has helped some.  Pertinent PMH:  Past Medical History  Diagnosis Date  . Cardiomyopathy     EF about 30%  . CAD (coronary artery disease)     apical MI  . Dyslipidemia   . 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 ICD (implantable cardiac defibrillator) in place     ICD - Guidant  . Anxiety and depression   . 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 Matthias Hughs)  . History of gastric ulcer 10/2010    Bx benign.  . Interstitial cystitis     Dr. Earlene Plater  . History of diverticulitis of colon   . BPH (benign prostatic hypertrophy) 01/22/2012    Nocturia is his biggest issue   . Benign skin lesion     Chondrodermatitis nodularis (right superior helix) --Dr. Terri Piedra  . Lynch syndrome     At higher risk for certain cancers: colon and bladder are the primary ones.  . Peyronie's disease     [facility]   Past surgical, social,  and family history reviewed and no changes noted since last office visit.  MEDS:  Outpatient Prescriptions Prior to Visit  Medication Sig Dispense Refill  . ALPRAZolam (XANAX) 0.5 MG tablet Take 0.5-1 mg by mouth 2 (two) times daily as needed for anxiety.      Marland Kitchen aspirin 81 MG EC tablet Take 81 mg by mouth daily. 1 tabs by mouth daily      . atorvastatin (LIPITOR) 20 MG tablet Take 1 tablet (20 mg total) by mouth daily.  90 tablet  1  . clopidogrel (PLAVIX) 75 MG tablet Take 1 tablet (75 mg total) by mouth daily.  90 tablet  4  . Coenzyme Q10 (CO Q 10) 100 MG CAPS Take 2 capsules by mouth daily.       . lansoprazole (PREVACID) 30 MG capsule Take 1 capsule (30 mg total) by mouth daily.  90 capsule  1  . metoprolol succinate (TOPROL-XL) 25 MG 24 hr tablet Take 0.5 tablets (12.5 mg total) by mouth daily.  90 tablet  1  . Multiple Vitamins-Minerals (CENTRUM SILVER PO) Take 1 tablet by mouth daily.      . Omega-3 Fatty Acids (FISH OIL) 1200 MG CAPS Take 1 capsule by mouth daily.      Marland Kitchen venlafaxine XR (EFFEXOR-XR) 37.5 MG 24 hr capsule Take 1  capsule (37.5 mg total) by mouth daily.  90 capsule  1  . albuterol (VENTOLIN HFA) 108 (90 BASE) MCG/ACT inhaler Inhale 2 puffs into the lungs every 4 (four) hours as needed for wheezing.  1 Inhaler  1  . nitroGLYCERIN (NITROSTAT) 0.4 MG SL tablet Place 0.4 mg under the tongue every 5 (five) minutes as needed for chest pain.      Marland Kitchen oxybutynin (DITROPAN-XL) 5 MG 24 hr tablet 1 tab po qAM for urinary emptying  14 tablet  1   No facility-administered medications prior to visit.    PE: Blood pressure 101/67, pulse 66, temperature 98.2 F (36.8 C), temperature source Temporal, resp. rate 18, height 5' 7.5" (1.715 m), weight 159 lb (72.122 kg), SpO2 98.00%. Gen: Alert, well appearing.  Patient is oriented to person, place, time, and situation. ENT: Eyes: no injection, icteris, swelling, or exudate.  EOMI, PERRLA. Nose: no drainage or turbinate edema/swelling.  No  injection or focal lesion.  Mouth: lips without lesion/swelling.  Oral mucosa pink and moist.  Dentition intact and without obvious caries or gingival swelling.  Oropharynx without erythema, exudate, or swelling.  Neck - No masses or thyromegaly or limitation in range of motion CV: RRR, no m/r/g.   LUNGS: CTA bilat, nonlabored resps, good aeration in all lung fields. ABD: soft, NT/ND Chest wall: no tenderness Back and axillary regions without any tenderness to palpation. When he lies supine today in exam room his pain and pressure go away.  LABS TODAY: none IMAGING TODAY: CXR: my impression is that he has no acute disease, awaiting radiologist's over-read. EKG today: NSR, rate 62, LAFB, old anterolateral infarct (no change compared to 10/20/12).   IMPRESSION AND PLAN: Chest pain, with back pressure: both seemingly without trigger. He looks great today and my clinical suspicion of significant acute pathologic process is low, but I do feel like a d-dimer should be done. If negative, will reassure pt and we have discussed signs and symptoms to return for. If positive, I will recommend he get a CT chest ASAP to r/o PE as the cause of his current sx's.  FOLLOW UP: prn--to be determined based on outcome of testing.  ADDENDUM: 5:15 PM--- Patient's D dimer came back mildly elevated at 0.56 (0.48 is ULN). I have contacted him and advised him to go to the ED for further eval and mgmt.

## 2013-02-04 NOTE — ED Notes (Signed)
Pt here for CT from his PCP.  Pt was seen due to CP with radiation to back.  He had a chest x-ray, EKG and D-dimer.  Pt states that D-dimer was elevated.  Pt has flown recently.  Pt appears in no distress

## 2013-02-05 ENCOUNTER — Telehealth: Payer: Self-pay | Admitting: Internal Medicine

## 2013-02-05 ENCOUNTER — Telehealth: Payer: Self-pay

## 2013-02-05 NOTE — Telephone Encounter (Signed)
New problem   FYI   Patient was seen in the er on last night was told to call the office set up an appt this week.     Attaching message from ED :  Discussed with cardiologist on call in [place]. He will arrange followup for Thursday or Friday of this week. I offered admission to the patient but he is comfortable with an outpatient visit .   Offer an appt with PA Tereso Newcomer on  10/31 @ 10:10 . Pt refused stating he does not want to see the PA . Ask was Dr. Tenny Craw in the office today , reply to patient that she was not . Patient stated he will find another way .

## 2013-02-05 NOTE — Telephone Encounter (Signed)
I spoke with Jake Carter & he has already called & talked with Dr. Tenny Craw. He will await return call from her.  I was going to offer him an appointment today with Dr. Ladona Ridgel as the Jake Carter sees both of them, but he said he would wait to hear back from Dr. Tenny Craw. Mylo Red RN

## 2013-02-05 NOTE — Telephone Encounter (Signed)
I have reviewed studies.  No change in EKG  Trop neg.  CT negative.  D Dimer only minimally elevated  Otherlabs normal. I am not convinced symptoms from heart or aorta  May be related to recent URI.  Follow   Call if worsens.

## 2013-02-09 ENCOUNTER — Telehealth: Payer: Self-pay | Admitting: Internal Medicine

## 2013-02-09 NOTE — Telephone Encounter (Signed)
Dr Tenny Craw has an available app this week thursday, taken to scheduling to call the pt and offer the time slot.

## 2013-02-09 NOTE — Telephone Encounter (Signed)
New Problem  Pt was in the hospital recently, still experiencing symptoms (no details) and does not want to wait for a later appt// in search of same week appt// please assist.

## 2013-02-12 ENCOUNTER — Ambulatory Visit: Payer: Medicare Other | Admitting: Family Medicine

## 2013-02-12 ENCOUNTER — Ambulatory Visit (INDEPENDENT_AMBULATORY_CARE_PROVIDER_SITE_OTHER): Payer: Medicare Other | Admitting: Internal Medicine

## 2013-02-12 VITALS — BP 100/64 | HR 64 | Ht 67.0 in | Wt 156.0 lb

## 2013-02-12 DIAGNOSIS — I5022 Chronic systolic (congestive) heart failure: Secondary | ICD-10-CM | POA: Diagnosis not present

## 2013-02-12 DIAGNOSIS — I25810 Atherosclerosis of coronary artery bypass graft(s) without angina pectoris: Secondary | ICD-10-CM | POA: Diagnosis not present

## 2013-02-12 DIAGNOSIS — I509 Heart failure, unspecified: Secondary | ICD-10-CM

## 2013-02-12 DIAGNOSIS — E785 Hyperlipidemia, unspecified: Secondary | ICD-10-CM

## 2013-02-12 NOTE — Progress Notes (Signed)
HPI Patient is a 67 year old with a history of CAD (status post MI with stent to the LAD in 2005), perfusion study in 2007  which showed an apical infarction, EF of 33. He underwent repeat  angiography that showed a 25% lesion before the stent and patent stent.  30% diagonal, otherwise, no significant disease. RCA was non-dominant.  EF was 25-30%  I saw him in clinic in Feb   He has been seen by Rosette Reveal since I talked to him on the phone about a wk ago  He was having epigastric and back pain  Went to ER  CT negative  He continue to have some  Not associated with activity He has not had any in 36 hours. Now he notes right hand pain  Not associated with activity.  COmes and goes  His angina in past was with activity. And bilateral  Allergies  Allergen Reactions  . Promethazine Hcl Other (See Comments)    Possible hallucinations    Current Outpatient Prescriptions  Medication Sig Dispense Refill  . albuterol (VENTOLIN HFA) 108 (90 BASE) MCG/ACT inhaler Inhale 2 puffs into the lungs every 4 (four) hours as needed for wheezing.  1 Inhaler  1  . ALPRAZolam (XANAX) 0.5 MG tablet Take 0.5-1 mg by mouth 2 (two) times daily as needed for anxiety.      Marland Kitchen aspirin 81 MG EC tablet Take 81 mg by mouth daily. 1 tabs by mouth daily      . atorvastatin (LIPITOR) 20 MG tablet Take 1 tablet (20 mg total) by mouth daily.  90 tablet  1  . clopidogrel (PLAVIX) 75 MG tablet Take 1 tablet (75 mg total) by mouth daily.  90 tablet  4  . Coenzyme Q10 (CO Q 10) 100 MG CAPS Take 2 capsules by mouth daily.       . lansoprazole (PREVACID) 30 MG capsule Take 1 capsule (30 mg total) by mouth daily.  90 capsule  1  . metoprolol succinate (TOPROL-XL) 25 MG 24 hr tablet Take 0.5 tablets (12.5 mg total) by mouth daily.  90 tablet  1  . Misc Natural Products (BLACK CHERRY CONCENTRATE PO) Take 1 capsule by mouth daily.      . Multiple Vitamins-Minerals (CENTRUM SILVER PO) Take 1 tablet by mouth daily.      . nitroGLYCERIN  (NITROSTAT) 0.4 MG SL tablet Place 0.4 mg under the tongue every 5 (five) minutes as needed for chest pain.      . Omega-3 Fatty Acids (FISH OIL) 1200 MG CAPS Take 1 capsule by mouth daily.      Marland Kitchen venlafaxine XR (EFFEXOR-XR) 37.5 MG 24 hr capsule Take 1 capsule (37.5 mg total) by mouth daily.  90 capsule  1   No current facility-administered medications for this visit.    Past Medical History  Diagnosis Date  . Cardiomyopathy     EF about 30%  . CAD (coronary artery disease)     apical MI  . Dyslipidemia   . 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 ICD (implantable cardiac defibrillator) in place     ICD - Guidant  . Anxiety and depression   . 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 Matthias Hughs)  . History of gastric ulcer 10/2010    Bx benign.  . Interstitial cystitis  Dr. Earlene Plater  . History of diverticulitis of colon   . BPH (benign prostatic hypertrophy) 01/22/2012    Nocturia is his biggest issue   . Benign skin lesion     Chondrodermatitis nodularis (right superior helix) --Dr. Terri Piedra  . Lynch syndrome     At higher risk for certain cancers: colon and bladder are the primary ones.  . Peyronie's disease     [facility]    Past Surgical History  Procedure Laterality Date  . Cardiac defibrillator placement  5/07    Guidant ICD placement  . Cardiac defibrillator placement      [employer] - Remote - yes  . Abdominal surgery  2007    partial colectomy for colon cancer  . Colonoscopy      Normal 01/18/2012 (Dr. Matthias Hughs)  . Coronary stent placement  2006  . Tonsillectomy  1972    Family History  Problem Relation Age of Onset  . Cancer Father     died at 31 (colon)  . Cancer Mother     died at 86 (breast ca)  . Cancer Brother     died at 14  . Cancer Sister     died at 73    History    Social History  . Marital Status: Married    Spouse Name: N/A    Number of Children: N/A  . Years of Education: N/A   Occupational History  . Not on file.   Social History Main Topics  . Smoking status: Former Smoker    Quit date: 04/10/1979  . Smokeless tobacco: Never Used  . Alcohol Use: Yes     Comment: wine on weekend  . Drug Use: No  . Sexual Activity: Not on file   Other Topics Concern  . Not on file   Social History Narrative   Married, one son in early 12s.  Lives in [place], [place].   One year of college education but held high level/leadership position with [employer] .   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  Eats healthy diet.             Review of Systems:  All systems reviewed.  They are negative to the above problem except as previously stated.  Vital Signs: BP 100/64  Pulse 64  Ht 5\' 7"  (1.702 m)  Wt 156 lb (70.761 kg)  BMI 24.43 kg/m2  Physical Exam Patient is in NAD HEENT:  Normocephalic, atraumatic. EOMI, PERRLA.  Neck: JVP is normal.  No bruits.  Lungs: clear to auscultation. No rales no wheezes.  Heart: Regular rate and rhythm. Normal S1, S2. No S3.   No significant murmurs. PMI not displaced.  Abdomen:  Supple, nontender. Normal bowel sounds. No masses. No hepatomegaly.  Extremities:   Good distal pulses throughout. No lower extremity edema.  Musculoskeletal :moving all extremities.  Neuro:   alert and oriented x3.  CN II-XII grossly intact.   Assessment and Plan: 1.  Pain  Patinet had epigastric to back paint that has resolved  Follow  CT neg R hand pain== I do not think this is an anginal equivalent  Follow  Consider neck Xrays    2  Cardiomyopathy  Volume status looks good   2.  CAD  As above  3.  HL  Keep on statin    F/u in the fall.

## 2013-03-09 ENCOUNTER — Telehealth: Payer: Self-pay | Admitting: Family Medicine

## 2013-03-09 MED ORDER — ALPRAZOLAM 0.5 MG PO TABS
0.5000 mg | ORAL_TABLET | Freq: Two times a day (BID) | ORAL | Status: DC | PRN
Start: 2013-03-09 — End: 2014-08-17

## 2013-03-09 NOTE — Telephone Encounter (Signed)
Alprazolam rx printed. 

## 2013-03-09 NOTE — Telephone Encounter (Signed)
Patient requesting refill of alprazolam.  Patient last seen 01/19/13.   Last rx was printed on 10/07/12.  Please advise refill.

## 2013-03-16 ENCOUNTER — Other Ambulatory Visit: Payer: Self-pay | Admitting: Family Medicine

## 2013-04-09 DIAGNOSIS — E559 Vitamin D deficiency, unspecified: Secondary | ICD-10-CM

## 2013-04-09 HISTORY — DX: Vitamin D deficiency, unspecified: E55.9

## 2013-04-27 ENCOUNTER — Ambulatory Visit (INDEPENDENT_AMBULATORY_CARE_PROVIDER_SITE_OTHER): Payer: Medicare Other | Admitting: *Deleted

## 2013-04-27 DIAGNOSIS — I428 Other cardiomyopathies: Secondary | ICD-10-CM | POA: Diagnosis not present

## 2013-04-28 DIAGNOSIS — R1013 Epigastric pain: Secondary | ICD-10-CM | POA: Diagnosis not present

## 2013-04-28 DIAGNOSIS — K219 Gastro-esophageal reflux disease without esophagitis: Secondary | ICD-10-CM | POA: Diagnosis not present

## 2013-04-28 DIAGNOSIS — Z1509 Genetic susceptibility to other malignant neoplasm: Secondary | ICD-10-CM | POA: Diagnosis not present

## 2013-04-28 DIAGNOSIS — Z79899 Other long term (current) drug therapy: Secondary | ICD-10-CM | POA: Diagnosis not present

## 2013-04-28 LAB — MDC_IDC_ENUM_SESS_TYPE_REMOTE
Brady Statistic RV Percent Paced: 0 %
Date Time Interrogation Session: 20150120010200
HighPow Impedance: 53 Ohm
Implantable Pulse Generator Serial Number: 123770
Lead Channel Impedance Value: 708 Ohm
Lead Channel Sensing Intrinsic Amplitude: 13 mV
Lead Channel Setting Pacing Amplitude: 2.4 V
MDC IDC SET LEADCHNL RV PACING PULSEWIDTH: 0.4 ms
MDC IDC SET ZONE DETECTION INTERVAL: 250 ms
MDC IDC SET ZONE DETECTION INTERVAL: 300 ms
Zone Setting Detection Interval: 353 ms

## 2013-05-04 ENCOUNTER — Encounter: Payer: Self-pay | Admitting: Internal Medicine

## 2013-05-05 ENCOUNTER — Encounter: Payer: Self-pay | Admitting: *Deleted

## 2013-05-10 HISTORY — PX: ESOPHAGOGASTRODUODENOSCOPY: SHX1529

## 2013-05-20 ENCOUNTER — Other Ambulatory Visit: Payer: Self-pay | Admitting: Family Medicine

## 2013-05-27 DIAGNOSIS — Z85038 Personal history of other malignant neoplasm of large intestine: Secondary | ICD-10-CM | POA: Diagnosis not present

## 2013-05-27 DIAGNOSIS — K219 Gastro-esophageal reflux disease without esophagitis: Secondary | ICD-10-CM | POA: Diagnosis not present

## 2013-05-27 DIAGNOSIS — Z800 Family history of malignant neoplasm of digestive organs: Secondary | ICD-10-CM | POA: Diagnosis not present

## 2013-05-27 DIAGNOSIS — K319 Disease of stomach and duodenum, unspecified: Secondary | ICD-10-CM | POA: Diagnosis not present

## 2013-05-27 DIAGNOSIS — Z1509 Genetic susceptibility to other malignant neoplasm: Secondary | ICD-10-CM | POA: Diagnosis not present

## 2013-05-27 DIAGNOSIS — Z79899 Other long term (current) drug therapy: Secondary | ICD-10-CM | POA: Diagnosis not present

## 2013-05-27 DIAGNOSIS — Z09 Encounter for follow-up examination after completed treatment for conditions other than malignant neoplasm: Secondary | ICD-10-CM | POA: Diagnosis not present

## 2013-05-27 DIAGNOSIS — C189 Malignant neoplasm of colon, unspecified: Secondary | ICD-10-CM | POA: Diagnosis not present

## 2013-06-24 ENCOUNTER — Encounter: Payer: Self-pay | Admitting: Family Medicine

## 2013-07-17 ENCOUNTER — Encounter: Payer: Self-pay | Admitting: Family Medicine

## 2013-07-17 ENCOUNTER — Ambulatory Visit (INDEPENDENT_AMBULATORY_CARE_PROVIDER_SITE_OTHER): Payer: Medicare Other | Admitting: Family Medicine

## 2013-07-17 VITALS — BP 103/67 | HR 56 | Temp 98.4°F | Resp 18 | Ht 67.5 in | Wt 159.0 lb

## 2013-07-17 DIAGNOSIS — Z0389 Encounter for observation for other suspected diseases and conditions ruled out: Secondary | ICD-10-CM

## 2013-07-17 DIAGNOSIS — F341 Dysthymic disorder: Secondary | ICD-10-CM

## 2013-07-17 DIAGNOSIS — E785 Hyperlipidemia, unspecified: Secondary | ICD-10-CM | POA: Diagnosis not present

## 2013-07-17 DIAGNOSIS — I2510 Atherosclerotic heart disease of native coronary artery without angina pectoris: Secondary | ICD-10-CM | POA: Diagnosis not present

## 2013-07-17 DIAGNOSIS — I509 Heart failure, unspecified: Secondary | ICD-10-CM

## 2013-07-17 DIAGNOSIS — F329 Major depressive disorder, single episode, unspecified: Secondary | ICD-10-CM

## 2013-07-17 DIAGNOSIS — F419 Anxiety disorder, unspecified: Secondary | ICD-10-CM

## 2013-07-17 MED ORDER — NITROGLYCERIN 0.4 MG SL SUBL: 0.4000 mg | SUBLINGUAL_TABLET | SUBLINGUAL | Status: DC | PRN

## 2013-07-17 NOTE — Progress Notes (Signed)
OFFICE NOTE  07/20/2013  CC:  Chief Complaint  Patient presents with  . Follow-up    6 month     HPI: Patient is a 68 y.o. Caucasian male who is here for 6 mo f/u anx/dep, hx of CAD with cardiomyopathy.   Doing well--"just hyper". Mood stable.  Constipation ok on senakot S but sounds like he needs to increase dose some. No SOB/CP/dizziness/palpitations with usual daily activities.  When I brought up prostate cancer screening today he said Dr. Cristina Gong (his GI MD) did a DRE at the time of his most recent colonoscopy and told pt it felt normal. I can't find a PSA since the last one I did 08/07/2011, although there seems to be one recent urologist f/u note that we don't have yet for review. He says Dr. Cristina Gong also did some blood tests recently and told him he needed to take a 2000 IU vit D tab daily.  Pertinent PMH:  Past medical, surgical, social, hx reviewed/updated.  MEDS:  Outpatient Prescriptions Prior to Visit  Medication Sig Dispense Refill  . ALPRAZolam (XANAX) 0.5 MG tablet Take 1-2 tablets (0.5-1 mg total) by mouth 2 (two) times daily as needed for anxiety.  60 tablet  2  . aspirin 81 MG EC tablet Take 81 mg by mouth daily. 1 tabs by mouth daily      . atorvastatin (LIPITOR) 20 MG tablet Take 1 tablet (20 mg total) by mouth daily.  90 tablet  1  . clopidogrel (PLAVIX) 75 MG tablet Take 1 tablet (75 mg total) by mouth daily.  90 tablet  4  . Coenzyme Q10 (CO Q 10) 100 MG CAPS Take 2 capsules by mouth daily.       . lansoprazole (PREVACID) 30 MG capsule Take 1 capsule by mouth  daily  90 capsule  1  . metoprolol succinate (TOPROL-XL) 25 MG 24 hr tablet Take 0.5 tablets (12.5 mg total) by mouth daily.  90 tablet  1  . Misc Natural Products (BLACK CHERRY CONCENTRATE PO) Take 1 capsule by mouth daily.      . Multiple Vitamins-Minerals (CENTRUM SILVER PO) Take 1 tablet by mouth daily.      . Omega-3 Fatty Acids (FISH OIL) 1200 MG CAPS Take 1 capsule by mouth daily.      Marland Kitchen  venlafaxine XR (EFFEXOR-XR) 37.5 MG 24 hr capsule Take 1 capsule (37.5 mg total) by mouth daily.  90 capsule  1  . albuterol (VENTOLIN HFA) 108 (90 BASE) MCG/ACT inhaler Inhale 2 puffs into the lungs every 4 (four) hours as needed for wheezing.  1 Inhaler  1  . metoprolol succinate (TOPROL-XL) 25 MG 24 hr tablet Take one-half tablet by  mouth daily  45 tablet  1  . nitroGLYCERIN (NITROSTAT) 0.4 MG SL tablet Place 0.4 mg under the tongue every 5 (five) minutes as needed for chest pain.       No facility-administered medications prior to visit.    PE: Blood pressure 103/67, pulse 56, temperature 98.4 F (36.9 C), temperature source Temporal, resp. rate 18, height 5' 7.5" (1.715 m), weight 159 lb (72.122 kg), SpO2 98.00%. Gen: Alert, well appearing.  Patient is oriented to person, place, time, and situation. BPZ:WCHE: no injection, icteris, swelling, or exudate.  EOMI, PERRLA. Mouth: lips without lesion/swelling.  Oral mucosa pink and moist. Oropharynx without erythema, exudate, or swelling.  CV: RRR, no m/r/g.   LUNGS: CTA bilat, nonlabored resps, good aeration in all lung fields. EXT: no clubbing, cyanosis,  or edema.   OEU:MPNT today  IMPRESSION AND PLAN:  Anxiety and depression The current medical regimen is effective;  continue present plan and medications.   HYPERLIPIDEMIA-MIXED Tolerating statin fine.  Check FLP/CMET at pt's earliest convenience.  CONGESTIVE HEART FAILURE, UNSPECIFIED The current medical regimen is effective;  continue present plan and medications. Check lytes,cr. Keep cardiology f/u visits.  Observation for suspected malignant neoplasm Screening PSA with upcoming fasting blood work at his earliest convenience.  CAD, NATIVE VESSEL The current medical regimen is effective;  continue present plan and medications. Keep cards f/u.   An After Visit Summary was printed and given to the patient.  Labs ordered for future to be done via [facility] on [address] in [place], [place].  FOLLOW UP: 6 mo routine chronic illness f/u

## 2013-07-17 NOTE — Progress Notes (Signed)
Pre visit review using our clinic review tool, if applicable. No additional management support is needed unless otherwise documented below in the visit note. 

## 2013-07-20 ENCOUNTER — Ambulatory Visit: Payer: Medicare Other | Admitting: Family Medicine

## 2013-07-20 DIAGNOSIS — Z0389 Encounter for observation for other suspected diseases and conditions ruled out: Secondary | ICD-10-CM | POA: Insufficient documentation

## 2013-07-20 NOTE — Assessment & Plan Note (Signed)
The current medical regimen is effective;  continue present plan and medications.  

## 2013-07-20 NOTE — Assessment & Plan Note (Signed)
The current medical regimen is effective;  continue present plan and medications. Check lytes,cr. Keep cardiology f/u visits.

## 2013-07-20 NOTE — Assessment & Plan Note (Signed)
Screening PSA with upcoming fasting blood work at his earliest convenience.

## 2013-07-20 NOTE — Assessment & Plan Note (Signed)
The current medical regimen is effective;  continue present plan and medications. Keep cards f/u.

## 2013-07-20 NOTE — Assessment & Plan Note (Signed)
Tolerating statin fine.  Check FLP/CMET at pt's earliest convenience.

## 2013-07-28 ENCOUNTER — Other Ambulatory Visit: Payer: Self-pay | Admitting: Family Medicine

## 2013-07-28 DIAGNOSIS — I2510 Atherosclerotic heart disease of native coronary artery without angina pectoris: Secondary | ICD-10-CM | POA: Diagnosis not present

## 2013-07-28 DIAGNOSIS — Z0389 Encounter for observation for other suspected diseases and conditions ruled out: Secondary | ICD-10-CM | POA: Diagnosis not present

## 2013-07-29 ENCOUNTER — Encounter: Payer: Self-pay | Admitting: Internal Medicine

## 2013-07-29 ENCOUNTER — Ambulatory Visit (INDEPENDENT_AMBULATORY_CARE_PROVIDER_SITE_OTHER): Payer: Medicare Other | Admitting: *Deleted

## 2013-07-29 DIAGNOSIS — I509 Heart failure, unspecified: Secondary | ICD-10-CM | POA: Diagnosis not present

## 2013-07-29 LAB — COMPREHENSIVE METABOLIC PANEL
ALT: 17 U/L (ref 0–53)
AST: 19 U/L (ref 0–37)
Albumin: 4.3 g/dL (ref 3.5–5.2)
Alkaline Phosphatase: 50 U/L (ref 39–117)
BILIRUBIN TOTAL: 0.7 mg/dL (ref 0.2–1.2)
BUN: 23 mg/dL (ref 6–23)
CO2: 31 mEq/L (ref 19–32)
CREATININE: 1.05 mg/dL (ref 0.50–1.35)
Calcium: 9.6 mg/dL (ref 8.4–10.5)
Chloride: 101 mEq/L (ref 96–112)
GLUCOSE: 90 mg/dL (ref 70–99)
Potassium: 4.9 mEq/L (ref 3.5–5.3)
Sodium: 139 mEq/L (ref 135–145)
TOTAL PROTEIN: 7 g/dL (ref 6.0–8.3)

## 2013-07-29 LAB — PSA, MEDICARE: PSA: 3.29 ng/mL (ref <=4.00)

## 2013-07-29 LAB — CBC WITH DIFFERENTIAL/PLATELET
BASOS ABS: 0 10*3/uL (ref 0.0–0.1)
BASOS PCT: 0 % (ref 0–1)
EOS PCT: 2 % (ref 0–5)
Eosinophils Absolute: 0.1 10*3/uL (ref 0.0–0.7)
HEMATOCRIT: 41 % (ref 39.0–52.0)
Hemoglobin: 13.7 g/dL (ref 13.0–17.0)
Lymphocytes Relative: 40 % (ref 12–46)
Lymphs Abs: 1.8 10*3/uL (ref 0.7–4.0)
MCH: 27.1 pg (ref 26.0–34.0)
MCHC: 33.4 g/dL (ref 30.0–36.0)
MCV: 81.2 fL (ref 78.0–100.0)
Monocytes Absolute: 0.4 10*3/uL (ref 0.1–1.0)
Monocytes Relative: 9 % (ref 3–12)
Neutro Abs: 2.3 10*3/uL (ref 1.7–7.7)
Neutrophils Relative %: 49 % (ref 43–77)
PLATELETS: 268 10*3/uL (ref 150–400)
RBC: 5.05 MIL/uL (ref 4.22–5.81)
RDW: 15 % (ref 11.5–15.5)
WBC: 4.6 10*3/uL (ref 4.0–10.5)

## 2013-07-29 LAB — LIPID PANEL
CHOL/HDL RATIO: 1.9 ratio
CHOLESTEROL: 138 mg/dL (ref 0–200)
HDL: 72 mg/dL (ref >39)
LDL Cholesterol: 58 mg/dL (ref 0–99)
Triglycerides: 38 mg/dL (ref <150)
VLDL: 8 mg/dL (ref 0–40)

## 2013-07-29 LAB — TSH: TSH: 1.35 u[IU]/mL (ref 0.350–4.500)

## 2013-08-02 LAB — MDC_IDC_ENUM_SESS_TYPE_REMOTE
Battery Remaining Longevity: 144 mo
Brady Statistic RV Percent Paced: 0 %
Date Time Interrogation Session: 20150422061400
HighPow Impedance: 51 Ohm
Implantable Pulse Generator Serial Number: 123770
Lead Channel Impedance Value: 711 Ohm
Lead Channel Sensing Intrinsic Amplitude: 14.2 mV
Lead Channel Setting Pacing Amplitude: 2.4 V
MDC IDC SET LEADCHNL RV PACING PULSEWIDTH: 0.4 ms
MDC IDC SET ZONE DETECTION INTERVAL: 250 ms
MDC IDC SET ZONE DETECTION INTERVAL: 353 ms
Zone Setting Detection Interval: 300 ms

## 2013-08-06 ENCOUNTER — Telehealth: Payer: Self-pay | Admitting: Oncology

## 2013-08-06 ENCOUNTER — Other Ambulatory Visit: Payer: Self-pay | Admitting: *Deleted

## 2013-08-06 ENCOUNTER — Other Ambulatory Visit: Payer: Self-pay | Admitting: Nurse Practitioner

## 2013-08-06 ENCOUNTER — Other Ambulatory Visit (HOSPITAL_COMMUNITY)
Admission: RE | Admit: 2013-08-06 | Discharge: 2013-08-06 | Disposition: A | Discharge status: Home / Self Care | Payer: Medicare Other | Source: Ambulatory Visit | Attending: Oncology | Admitting: Oncology

## 2013-08-06 ENCOUNTER — Ambulatory Visit: Payer: Medicare Other | Admitting: Lab

## 2013-08-06 ENCOUNTER — Encounter: Payer: Self-pay | Admitting: Family Medicine

## 2013-08-06 ENCOUNTER — Ambulatory Visit (HOSPITAL_BASED_OUTPATIENT_CLINIC_OR_DEPARTMENT_OTHER): Payer: Medicare Other | Admitting: Lab

## 2013-08-06 ENCOUNTER — Ambulatory Visit (HOSPITAL_BASED_OUTPATIENT_CLINIC_OR_DEPARTMENT_OTHER): Payer: Medicare Other | Admitting: Oncology

## 2013-08-06 VITALS — BP 105/68 | HR 60 | Temp 97.5°F | Resp 18 | Ht 67.0 in | Wt 156.1 lb

## 2013-08-06 DIAGNOSIS — R109 Unspecified abdominal pain: Secondary | ICD-10-CM | POA: Diagnosis not present

## 2013-08-06 DIAGNOSIS — C182 Malignant neoplasm of ascending colon: Secondary | ICD-10-CM

## 2013-08-06 DIAGNOSIS — I2510 Atherosclerotic heart disease of native coronary artery without angina pectoris: Secondary | ICD-10-CM | POA: Diagnosis not present

## 2013-08-06 DIAGNOSIS — Z85038 Personal history of other malignant neoplasm of large intestine: Secondary | ICD-10-CM

## 2013-08-06 DIAGNOSIS — R319 Hematuria, unspecified: Secondary | ICD-10-CM | POA: Insufficient documentation

## 2013-08-06 LAB — CEA: CEA: <0.5 ng/mL (ref 0.0–5.0)

## 2013-08-06 NOTE — Progress Notes (Signed)
Per Dr. Benay Spice, received order to schedule appointment with [facility] counselor.  POF sent to scheduler.  Referral placed in EPIC.

## 2013-08-06 NOTE — Telephone Encounter (Signed)
Gave pt appt for lab and MD for APril 2016, sent pt labs today

## 2013-08-06 NOTE — Progress Notes (Signed)
  [facility] OFFICE PROGRESS NOTE   Diagnosis: Colon cancer, hereditary non-polyposis colon cancer syndrome  INTERVAL HISTORY:   He returns as scheduled. He feels well. Stable right parietal scalp nodule. He continues endoscopy and colonoscopy with Dr. Cristina Gong. He is followed by Dr. Diona Fanti.  Objective:  Vital signs in last 24 hours:  Blood pressure 105/68, pulse 60, temperature 97.5 F (36.4 C), temperature source Oral, resp. rate 18, height $RemoveBe'5\' 7"'SPsKVlslQ$  (1.702 m), weight 156 lb 1.6 oz (70.806 kg), SpO2 98.00%.    HEENT: Neck without mass, 1/2 cm firm right parietal scalp nodule Lymphatics: Pea-sized left posterior cervical node, no other cervical, supra-clavicular, axillar, or inguinal nodes Resp: Lungs clear bilaterally Cardio: Regular rate and rhythm GI: No hepatosplenomegaly, nontender, no mass Vascular: No leg edema   Lab Results:  Lab Results  Component Value Date   WBC 4.6 07/28/2013   HGB 13.7 07/28/2013   HCT 41.0 07/28/2013   MCV 81.2 07/28/2013   PLT 268 07/28/2013   NEUTROABS 2.3 07/28/2013    Lab Results  Component Value Date   CEA 0.8 08/05/2012    Imaging:  No results found.  Medications: I have reviewed the patient's current medications.  Assessment/Plan: 1.Stage II colon cancer diagnosed in April 2006. He remains in clinical remission.  2. 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 - he continues yearly surveillance endoscopy and colonoscopy procedures by Dr Cristina Gong. Last colonoscopy was in February 2015 3. Stable small left posterior cervical lymph node.  4. History of coronary artery disease, status post placement of a defibrillator.  5. 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 The sebaceous neoplasm may be  related to the Lynch syndrome diagnosis.  6. Discomfort in the left inguinal region -chronic, ? related to interstitial cystitis. He is followed by Dr.Dahlstedt.  7. Gastric ulcers diagnosed in July 2012, biopsy benign, now maintained on Prevacid and followed by Dr.Buccini    Disposition:  He remains in clinical remission from colon cancer. We will followup on the CEA and urine cytology from today. He will continue surveillance endoscopies with Dr. Cristina Gong and with Dr. Diona Fanti for urinary symptoms. He would like to [facility] at the [facility]. He will be scheduled for an office and lab visit in one year.  Ladell Pier, MD  08/06/2013  12:16 PM

## 2013-08-06 NOTE — Telephone Encounter (Signed)
Talked to pt and gave him appt for genetics for June 2015

## 2013-08-07 ENCOUNTER — Telehealth: Payer: Self-pay | Admitting: *Deleted

## 2013-08-07 NOTE — Telephone Encounter (Signed)
Message copied by Brien Few on Fri Aug 07, 2013  3:55 PM ------      Message from: Betsy Coder B      Created: Thu Aug 06, 2013  9:09 PM       Please call patient, cea is normal ------

## 2013-08-07 NOTE — Telephone Encounter (Signed)
Called pt with CEA results. Normal, per Dr. Benay Spice. He voiced understanding. Requested urine cytology results- Normal, per Dr. Learta Codding. Pt made aware.

## 2013-08-11 ENCOUNTER — Encounter: Payer: Self-pay | Admitting: Cardiology

## 2013-08-11 NOTE — Progress Notes (Signed)
ICD remote received. Sensing, impedances, auto capture thresholds consistent with previous device measurements. Histograms appropriate for patient and level of activity.  No ventricular arrhythmias recorded. All other diagnostic data reviewed and is appropriate and stable for patient. Real time EGM demonstrates appropriate sensing and capture. Estimated longevity 12 years.  Plan to check remotely in 3 months, see in office annually.  ROV in July with Dr. Lovena Le.

## 2013-08-20 ENCOUNTER — Encounter: Payer: Self-pay | Admitting: Family Medicine

## 2013-08-26 ENCOUNTER — Encounter: Payer: Self-pay | Admitting: Genetic Counselor

## 2013-08-26 ENCOUNTER — Telehealth: Payer: Self-pay | Admitting: Oncology

## 2013-08-26 ENCOUNTER — Ambulatory Visit: Payer: Medicare Other

## 2013-08-26 ENCOUNTER — Telehealth: Payer: Self-pay | Admitting: *Deleted

## 2013-08-26 ENCOUNTER — Ambulatory Visit (HOSPITAL_BASED_OUTPATIENT_CLINIC_OR_DEPARTMENT_OTHER): Payer: Medicare Other | Admitting: Genetic Counselor

## 2013-08-26 DIAGNOSIS — Z8051 Family history of malignant neoplasm of kidney: Secondary | ICD-10-CM | POA: Diagnosis not present

## 2013-08-26 DIAGNOSIS — Z85038 Personal history of other malignant neoplasm of large intestine: Secondary | ICD-10-CM | POA: Diagnosis not present

## 2013-08-26 DIAGNOSIS — Z800 Family history of malignant neoplasm of digestive organs: Secondary | ICD-10-CM | POA: Diagnosis not present

## 2013-08-26 DIAGNOSIS — Z803 Family history of malignant neoplasm of breast: Secondary | ICD-10-CM

## 2013-08-26 DIAGNOSIS — Z1509 Genetic susceptibility to other malignant neoplasm: Secondary | ICD-10-CM | POA: Diagnosis not present

## 2013-08-26 DIAGNOSIS — Z8049 Family history of malignant neoplasm of other genital organs: Secondary | ICD-10-CM

## 2013-08-26 NOTE — Telephone Encounter (Signed)
Received email from [facility] to change pt's genetic appt from June to today, 5/20.  Did as requested.

## 2013-08-26 NOTE — Progress Notes (Signed)
Patient Name: Jake Carter Patient Age: 68 y.o. Encounter Date: 08/26/2013  Referring Physician: Tammi Sou, MD [address] [place], [place] [address]  Primary Care Provider: Tammi Sou, MD   [name], a 68 y.o. male, is being seen at the [facility] due to a personal and family history of colon cancer and Lynch syndrome.  He presents to clinic today with his son, Jake Carter, to discuss Lynch syndrome as well as genetic testing for his son.  HISTORY OF PRESENT ILLNESS: Mr. Wortley was diagnosed with colon cancer at age 61 and was given a clinical diagnosis of Lynch syndrome which was then confirmed by molecular testing.  He was previously seen at [facility] and had genetic testing showing he has Lynch syndrome. 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 Testing was done by [employer] in 2006. Initially, this result was classified as a Variant of Unknown Significance, but classification changed to Suspected Deleterious in 2009. This alteration is deemed causative of Lynch syndrome.   Past Surgical History  Procedure Laterality Date  . Cardiac defibrillator placement  5/07    Guidant ICD placement  . Cardiac defibrillator placement      [employer] - Remote - yes  . Abdominal surgery  2007    partial colectomy for colon cancer  . Colonoscopy      Normal 01/18/2012 (Dr. Cristina Gong)  . Coronary stent placement  2006  . Tonsillectomy  1972  . Esophagogastroduodenoscopy  05/2013    Normal except mild antral gastritis (this was done for screening purposes due to dx of Lynch syndrome)    History   Social History  . Marital Status: Married    Spouse Name: N/A    Number of Children: N/A  . Years of Education: N/A   Social History Main Topics  . Smoking status: Former Smoker    Quit date: 04/10/1979  . Smokeless tobacco: Never Used  . Alcohol Use: Yes     Comment: wine on weekend  . Drug Use: No  . Sexual  Activity: Not on file   Other Topics Concern  . Not on file   Social History Narrative   Married, one son in early 19s.  Lives in [place], [place].   One year of college education but held high level/leadership position with [employer]   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  Eats healthy diet.              FAMILY HISTORY:   During the visit, a 4-generation pedigree was obtained. Significant diagnoses include the following:  Family History  Problem Relation Age of Onset  . Colon cancer Father 81    deceased 13  . Breast cancer Mother 74    deceased 77  . Colon cancer Brother 39    deceased 45  . Uterine cancer Sister 56    deceased 23  . Uterine cancer Paternal Aunt     deceased 52  . Colon cancer Paternal Uncle 17    deceased 34  . Colon cancer Paternal Grandmother 36    deceased 41  . Colon cancer Paternal Aunt 70    deceased 62  . Kidney cancer Paternal Uncle 64    deceased 68    Mr. Hashman's father had 63 siblings, may of whom are cancer-free. He has a daughter (age 70) who reportedly had negative genetic testing and two sons (  ages 40 and 21); all are cancer-free.  Mr. Dvorsky's ancestry is Italian. There is no known Jewish ancestry and no consanguinity.  ASSESSMENT AND PLAN: Mr. Commons is a 67 y.o. male with a personal and family history of colon cancer and Lynch syndrome. We reviewed the characteristics, features and inheritance patterns of Lynch syndrome and explained that each first-degree relative has a 50% chance to have inherited the MSH2 mutation identified in him. All blood relatives should have single-site testing for this specific mutation. We also discussed the process of testing, insurance coverage and implications of results.   No additional genetic testing was not recommend at this time. We recommended Mr. Schutter continue to close surveillance for Lynch-associated cancers.  We reviewed the current NCCN guidelines with him and his son and gave him a copy to share with his family. Mr. Urias had questions regarding potential genetic discrimination. We discussed the Genetic Information Nondiscrimination Act (GINA) and its protections and provided him with a handout with specific information about GINA.   We encouraged Mr. Gaines to remain in contact with Cancer Genetics annually so that we can update the family history and inform him of any changes in cancer genetics and testing that may be of benefit for this family. Mr.  Fleisher's questions were answered to his satisfaction today.   Thank you for the referral and allowing us to share in the care of your patient.   The patient was seen for a total of 35 minutes, greater than 50% of which was spent face-to-face counseling. This patient was discussed with the referring provider who agrees with the above.     

## 2013-08-26 NOTE — Telephone Encounter (Signed)
added lab per gen

## 2013-09-08 ENCOUNTER — Other Ambulatory Visit: Payer: Self-pay | Admitting: Family Medicine

## 2013-09-09 ENCOUNTER — Other Ambulatory Visit: Payer: Self-pay | Admitting: Family Medicine

## 2013-09-09 MED ORDER — VENLAFAXINE HCL ER 37.5 MG PO CP24: ORAL_CAPSULE | ORAL | Status: DC

## 2013-09-09 NOTE — Telephone Encounter (Signed)
Pt needed 30 day supply to go to local pharmacy while he waits on 90 day Rx to come from mail order.

## 2013-09-14 ENCOUNTER — Telehealth: Payer: Self-pay | Admitting: Oncology

## 2013-09-14 NOTE — Telephone Encounter (Signed)
Sent medical records to Rickard Patience at [facility]

## 2013-11-03 ENCOUNTER — Encounter: Payer: Self-pay | Admitting: *Deleted

## 2013-11-24 ENCOUNTER — Ambulatory Visit (INDEPENDENT_AMBULATORY_CARE_PROVIDER_SITE_OTHER): Payer: Medicare Other | Admitting: Urology

## 2013-11-24 DIAGNOSIS — R351 Nocturia: Secondary | ICD-10-CM

## 2013-11-24 DIAGNOSIS — R1032 Left lower quadrant pain: Secondary | ICD-10-CM

## 2013-11-24 DIAGNOSIS — N400 Enlarged prostate without lower urinary tract symptoms: Secondary | ICD-10-CM | POA: Diagnosis not present

## 2013-12-10 ENCOUNTER — Ambulatory Visit (INDEPENDENT_AMBULATORY_CARE_PROVIDER_SITE_OTHER): Payer: Medicare Other | Admitting: Internal Medicine

## 2013-12-10 ENCOUNTER — Encounter: Payer: Self-pay | Admitting: Internal Medicine

## 2013-12-10 VITALS — BP 100/64 | HR 67 | Ht 66.0 in | Wt 156.0 lb

## 2013-12-10 DIAGNOSIS — I2510 Atherosclerotic heart disease of native coronary artery without angina pectoris: Secondary | ICD-10-CM

## 2013-12-10 DIAGNOSIS — I509 Heart failure, unspecified: Secondary | ICD-10-CM | POA: Diagnosis not present

## 2013-12-10 DIAGNOSIS — Z95810 Presence of automatic (implantable) cardiac defibrillator: Secondary | ICD-10-CM | POA: Diagnosis not present

## 2013-12-10 LAB — MDC_IDC_ENUM_SESS_TYPE_INCLINIC
Brady Statistic RV Percent Paced: 1 % — CL
Date Time Interrogation Session: 20150903040000
HIGH POWER IMPEDANCE MEASURED VALUE: 60 Ohm
Implantable Pulse Generator Serial Number: 123770
Lead Channel Impedance Value: 794 Ohm
Lead Channel Pacing Threshold Amplitude: 0.9 V
Lead Channel Setting Pacing Pulse Width: 0.4 ms
MDC IDC MSMT LEADCHNL RV PACING THRESHOLD PULSEWIDTH: 0.4 ms
MDC IDC MSMT LEADCHNL RV SENSING INTR AMPL: 15.7 mV
MDC IDC SET LEADCHNL RV PACING AMPLITUDE: 2.4 V
MDC IDC SET LEADCHNL RV SENSING SENSITIVITY: 0.5 mV
MDC IDC SET ZONE DETECTION INTERVAL: 250 ms
Zone Setting Detection Interval: 300 ms
Zone Setting Detection Interval: 353 ms

## 2013-12-10 NOTE — Progress Notes (Signed)
HPI Jake Carter returns today for followup. He is a very pleasant 67 year old man with an ischemic cardiomyopathy, chronic class I systolic heart failure, an ejection fraction of 30%, status post ICD implantation. In the interim, he has been stable. He denies chest pain, shortness of breath, or syncope. No ICD shocks. No peripheral edema or claudication. Allergies  Allergen Reactions  . Promethazine Hcl Other (See Comments)    Possible hallucinations     Current Outpatient Prescriptions  Medication Sig Dispense Refill  . albuterol (VENTOLIN HFA) 108 (90 BASE) MCG/ACT inhaler Inhale 2 puffs into the lungs every 4 (four) hours as needed for wheezing.  1 Inhaler  1  . ALPRAZolam (XANAX) 0.5 MG tablet Take 1-2 tablets (0.5-1 mg total) by mouth 2 (two) times daily as needed for anxiety.  60 tablet  2  . aspirin 81 MG EC tablet Take 81 mg by mouth daily. 1 tabs by mouth daily      . atorvastatin (LIPITOR) 20 MG tablet Take 1 tablet by mouth  daily  90 tablet  1  . Cholecalciferol 2000 UNITS TABS Take 2,000 Units by mouth daily.      . clopidogrel (PLAVIX) 75 MG tablet Take 1 tablet (75 mg total) by mouth daily.  90 tablet  4  . Coenzyme Q10 (CO Q 10) 100 MG CAPS Take 2 capsules by mouth daily.       . lansoprazole (PREVACID) 30 MG capsule Take 1 capsule by mouth  daily  90 capsule  1  . metoprolol succinate (TOPROL-XL) 25 MG 24 hr tablet Take 0.5 tablets (12.5 mg total) by mouth daily.  90 tablet  1  . Misc Natural Products (BLACK CHERRY CONCENTRATE PO) Take 1 capsule by mouth daily.      . Multiple Vitamins-Minerals (CENTRUM SILVER PO) Take 1 tablet by mouth daily.      . nitroGLYCERIN (NITROSTAT) 0.4 MG SL tablet Place 1 tablet (0.4 mg total) under the tongue every 5 (five) minutes as needed for chest pain.  15 tablet  2  . Omega-3 Fatty Acids (FISH OIL) 1200 MG CAPS Take 1 capsule by mouth daily.      Marland Kitchen venlafaxine XR (EFFEXOR-XR) 37.5 MG 24 hr capsule Take 1 capsule by mouth  daily  30 capsule   0   No current facility-administered medications for this visit.     Past Medical History  Diagnosis Date  . Cardiomyopathy     EF about 30%  . CAD (coronary artery disease)     apical MI  . 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 ICD (implantable cardiac defibrillator) in place     ICD - Guidant  . Anxiety and depression   . 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 Cristina Gong)  . History of gastric ulcer 10/2010    Bx benign.  . Interstitial cystitis     Dr. Rosana Hoes  . History of diverticulitis of colon   . BPH (benign prostatic hypertrophy) 01/22/2012    Nocturia is his biggest issue   . Benign skin lesion     Chondrodermatitis nodularis (right superior helix) --Dr. Allyson Sabal  . Lynch syndrome     At higher risk for certain cancers: colon and bladder are the primary ones.  . Peyronie's disease     [facility]  . Lynch syndrome  MSH2 mutation    ROS:   All systems reviewed and negative except as noted in the HPI.   Past Surgical History  Procedure Laterality Date  . Cardiac defibrillator placement  5/07    Guidant ICD placement  . Cardiac defibrillator placement      [employer] - Remote - yes  . Abdominal surgery  2007    partial colectomy for colon cancer  . Colonoscopy      Normal 01/18/2012 (Dr. Cristina Gong)  . Coronary stent placement  2006  . Tonsillectomy  1972  . Esophagogastroduodenoscopy  05/2013    Normal except mild antral gastritis (this was done for screening purposes due to dx of Lynch syndrome)     Family History  Problem Relation Age of Onset  . Colon cancer Father 64    deceased 36  . Breast cancer Mother 37    deceased 69  . Colon cancer Brother 88    deceased 17  . Uterine cancer Sister 27    deceased 22  . Uterine cancer Paternal Aunt     deceased 20  . Colon  cancer Paternal Uncle 67    deceased 15  . Colon cancer Paternal Grandmother 76    deceased 47  . Colon cancer Paternal Aunt 21    deceased 52  . Kidney cancer Paternal Uncle 97    deceased 71     History   Social History  . Marital Status: Married    Spouse Name: N/A    Number of Children: N/A  . Years of Education: N/A   Occupational History  . Not on file.   Social History Main Topics  . Smoking status: Former Smoker    Quit date: 04/10/1979  . Smokeless tobacco: Never Used  . Alcohol Use: Yes     Comment: wine on weekend  . Drug Use: No  . Sexual Activity: Not on file   Other Topics Concern  . Not on file   Social History Narrative   Married, one son in early 31s.  Lives in [place], [place].   One year of college education but held high level/leadership position with [employer]   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  Eats healthy diet.              BP 100/64  Pulse 67  Ht 5' 6"  (1.676 m)  Wt 156 lb (70.761 kg)  BMI 25.19 kg/m2  Physical Exam:  Well appearing 68 year old man, NAD HEENT: Unremarkable Neck:  6 cm JVD, no thyromegally Back:  No CVA tenderness Lungs:  Clear with no wheezes, rales, or rhonchi. HEART:  Regular rate rhythm, no murmurs, no rubs, no clicks Abd:  soft, positive bowel sounds, no organomegally, no rebound, no guarding Ext:  2 plus pulses, no edema, no cyanosis, no clubbing Skin:  No rashes no nodules Neuro:  CN II through XII intact, motor grossly intact   DEVICE  Normal device function.  See PaceArt for details.   Assess/Plan:

## 2013-12-10 NOTE — Assessment & Plan Note (Signed)
He denies anginal symptoms. Will follow. 

## 2013-12-10 NOTE — Patient Instructions (Signed)
Your physician recommends that you continue on your current medications as directed. Please refer to the Current Medication list given to you today. Remote monitoring is used to monitor your Pacemaker of ICD from home. This monitoring reduces the number of office visits required to check your device to one time per year. It allows [place] to keep an eye on the functioning of your device to ensure it is working properly. You are scheduled for a device check from home on Mar 15, 2014. You may send your transmission at any time that day. If you have a wireless device, the transmission will be sent automatically. After your physician reviews your transmission, you will receive a postcard with your next transmission date. Your physician wants you to follow-up in: 1 year with Dr. Lovena Le.   You will receive a reminder letter in the mail two months in advance. If you don't receive a letter, please call our office to schedule the follow-up appointment.

## 2013-12-10 NOTE — Assessment & Plan Note (Signed)
His device is working normally. Will recheck in several months. 

## 2013-12-16 ENCOUNTER — Encounter: Payer: Self-pay | Admitting: Internal Medicine

## 2013-12-18 ENCOUNTER — Encounter: Payer: Self-pay | Admitting: Internal Medicine

## 2013-12-18 ENCOUNTER — Ambulatory Visit (INDEPENDENT_AMBULATORY_CARE_PROVIDER_SITE_OTHER): Payer: Medicare Other | Admitting: Internal Medicine

## 2013-12-18 VITALS — BP 91/63 | HR 62 | Ht 66.0 in | Wt 156.0 lb

## 2013-12-18 DIAGNOSIS — I2510 Atherosclerotic heart disease of native coronary artery without angina pectoris: Secondary | ICD-10-CM | POA: Diagnosis not present

## 2013-12-18 DIAGNOSIS — I509 Heart failure, unspecified: Secondary | ICD-10-CM

## 2013-12-18 NOTE — Patient Instructions (Signed)
Your physician recommends that you continue on your current medications as directed. Please refer to the Current Medication list given to you today. Your physician wants you to follow-up in: March 2016.   You will receive a reminder letter in the mail two months in advance. If you don't receive a letter, please call our office to schedule the follow-up appointment.

## 2013-12-18 NOTE — Progress Notes (Signed)
HPI Patient is a 68 year old with a history of CAD (status post MI with stent to the LAD in 2005), perfusion study in 2007  which showed an apical infarction, EF of 33. He underwent repeat  angiography that showed a 25% lesion before the stent and patent stent.  30% diagonal, otherwise, no significant disease. RCA was non-dominant.  EF was 25-30%  I saw him in clinic in Feb   He has been seen by Beckie Salts since I talked to him on the phone about a wk ago  He was having epigastric and back pain  Went to ER  CT negative  He continue to have some  Not associated with activity He has not had any in 36 hours. Now he notes right hand pain  Not associated with activity.  COmes and goes  His angina in past was with activity. And bilateral  Allergies  Allergen Reactions  . Promethazine Hcl Other (See Comments)    Possible hallucinations    Current Outpatient Prescriptions  Medication Sig Dispense Refill  . albuterol (VENTOLIN HFA) 108 (90 BASE) MCG/ACT inhaler Inhale 2 puffs into the lungs every 4 (four) hours as needed for wheezing.  1 Inhaler  1  . ALPRAZolam (XANAX) 0.5 MG tablet Take 1-2 tablets (0.5-1 mg total) by mouth 2 (two) times daily as needed for anxiety.  60 tablet  2  . aspirin 81 MG EC tablet Take 81 mg by mouth daily. 1 tabs by mouth daily      . atorvastatin (LIPITOR) 20 MG tablet Take 1 tablet by mouth  daily  90 tablet  1  . Cholecalciferol 2000 UNITS TABS Take 2,000 Units by mouth daily.      . clopidogrel (PLAVIX) 75 MG tablet Take 1 tablet (75 mg total) by mouth daily.  90 tablet  4  . Coenzyme Q10 (CO Q 10) 100 MG CAPS Take 2 capsules by mouth daily.       . lansoprazole (PREVACID) 30 MG capsule Take 1 capsule by mouth  daily  90 capsule  1  . metoprolol succinate (TOPROL-XL) 25 MG 24 hr tablet Take 0.5 tablets (12.5 mg total) by mouth daily.  90 tablet  1  . Misc Natural Products (BLACK CHERRY CONCENTRATE PO) Take 1 capsule by mouth daily.      . Multiple Vitamins-Minerals  (CENTRUM SILVER PO) Take 1 tablet by mouth daily.      . nitroGLYCERIN (NITROSTAT) 0.4 MG SL tablet Place 1 tablet (0.4 mg total) under the tongue every 5 (five) minutes as needed for chest pain.  15 tablet  2  . Omega-3 Fatty Acids (FISH OIL) 1200 MG CAPS Take 1 capsule by mouth daily.      Marland Kitchen venlafaxine XR (EFFEXOR-XR) 37.5 MG 24 hr capsule Take 1 capsule by mouth  daily  30 capsule  0   No current facility-administered medications for this visit.    Past Medical History  Diagnosis Date  . Cardiomyopathy     EF about 30%  . CAD (coronary artery disease)     apical MI  . 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 ICD (implantable cardiac defibrillator) in place     ICD - Guidant  . Anxiety and depression   . 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 Cristina Gong)  . History of gastric ulcer  10/2010    Bx benign.  . Interstitial cystitis     Dr. Rosana Hoes  . History of diverticulitis of colon   . BPH (benign prostatic hypertrophy) 01/22/2012    Nocturia is his biggest issue   . Benign skin lesion     Chondrodermatitis nodularis (right superior helix) --Dr. Allyson Sabal  . Lynch syndrome     At higher risk for certain cancers: colon and bladder are the primary ones.  . Peyronie's disease     [facility]  . Lynch syndrome     MSH2 mutation    Past Surgical History  Procedure Laterality Date  . Cardiac defibrillator placement  5/07    Guidant ICD placement  . Cardiac defibrillator placement      [employer] - Remote - yes  . Abdominal surgery  2007    partial colectomy for colon cancer  . Colonoscopy      Normal 01/18/2012 (Dr. Cristina Gong)  . Coronary stent placement  2006  . Tonsillectomy  1972  . Esophagogastroduodenoscopy  05/2013    Normal except mild antral gastritis (this was done for screening purposes due to dx of  Lynch syndrome)    Family History  Problem Relation Age of Onset  . Colon cancer Father 23    deceased 8  . Breast cancer Mother 37    deceased 59  . Colon cancer Brother 31    deceased 51  . Uterine cancer Sister 9    deceased 42  . Uterine cancer Paternal Aunt     deceased 25  . Colon cancer Paternal Uncle 25    deceased 78  . Colon cancer Paternal Grandmother 38    deceased 27  . Colon cancer Paternal Aunt 90    deceased 88  . Kidney cancer Paternal Uncle 14    deceased 24    History   Social History  . Marital Status: Married    Spouse Name: N/A    Number of Children: N/A  . Years of Education: N/A   Occupational History  . Not on file.   Social History Main Topics  . Smoking status: Former Smoker    Quit date: 04/10/1979  . Smokeless tobacco: Never Used  . Alcohol Use: Yes     Comment: wine on weekend  . Drug Use: No  . Sexual Activity: Not on file   Other Topics Concern  . Not on file   Social History Narrative   Married, one son in early 36s.  Lives in [place], [place].   One year of college education but held high level/leadership position with [employer]   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  Eats healthy diet.             Review of Systems:  All systems reviewed.  They are negative to the above problem except as previously stated.  Vital Signs: BP 91/63  Pulse 62  Ht _0  (1.676 m)  Wt 156 lb (70.761 kg)  BMI 25.19 kg/m2  Physical Exam Patient is in NAD HEENT:  Normocephalic, atraumatic. EOMI, PERRLA.  Neck: JVP is normal.  No bruits.  Lungs: clear to auscultation. No rales no wheezes.  Heart: Regular rate and rhythm. Normal S1, S2. No S3.   No significant murmurs. PMI not displaced.  Abdomen:  Supple, nontender. Normal bowel sounds. No masses. No hepatomegaly.  Extremities:   Good distal pulses throughout. No lower extremity edema.  Musculoskeletal :moving  all extremities.  Neuro:   alert and oriented x3.  CN II-XII grossly intact.  EKG  SR 62 bpm   LAFB  Anterolateral MI   Assessment and Plan: 1  Cardiomyopathy  Volume status looks good   BP is low today but he is not  Dizzy  He has not tolerated titration of meds in the past   Gets dizzy   I would conitnue current regimen.    2.  CAD  Remains symptom free  3.  HL  Keep on statin    F/u in the March  Will be moving after to [facility].

## 2014-01-04 ENCOUNTER — Other Ambulatory Visit: Payer: Self-pay | Admitting: Family Medicine

## 2014-01-22 ENCOUNTER — Other Ambulatory Visit: Payer: Self-pay

## 2014-01-25 DIAGNOSIS — Z23 Encounter for immunization: Secondary | ICD-10-CM | POA: Diagnosis not present

## 2014-02-05 IMAGING — CT CT ANGIO CHEST
2 of 6 series · 6 of 36 positions shown · IV contrast (Omnipaque 300)
Comparison: 09/09/2006

CLINICAL DATA: Chest pain, coronary stents. Colon carcinoma.

EXAM:
CT ANGIOGRAPHY CHEST WITH CONTRAST
TECHNIQUE: Multidetector CT imaging of the chest was performed using the
standard protocol during bolus administration of intravenous
contrast. Multiplanar CT image reconstructions including MIPs were
obtained to evaluate the vascular anatomy.
CONTRAST:  100mL OMNIPAQUE IOHEXOL 350 MG/ML SOLN

[Series 5: pe 3.0 b40f · axial · 0.63mm/px · z∈[-336,-144]mm · 5 of 98 slices shown]
[im 17/98  lung]
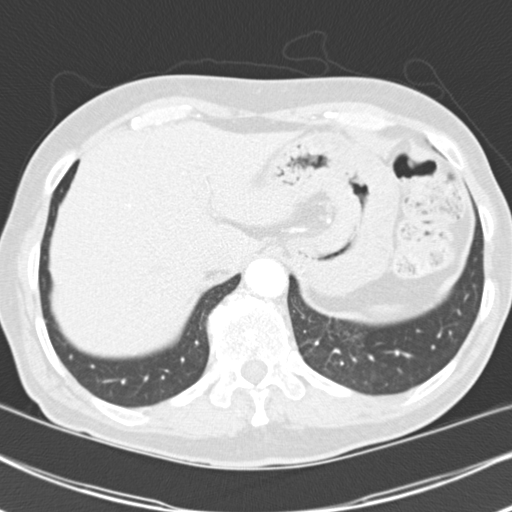
[im 33/98  mediastinal]
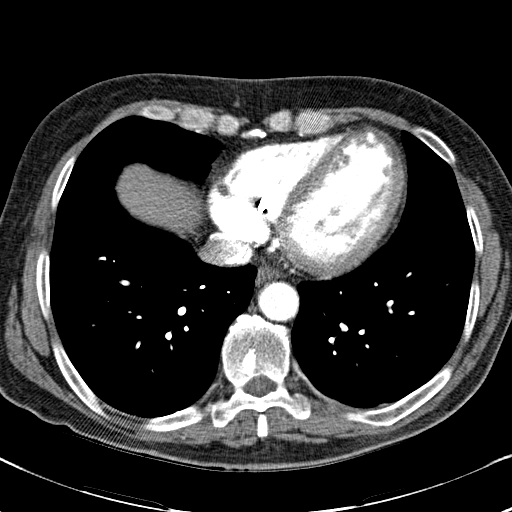
[im 49/98  lung]
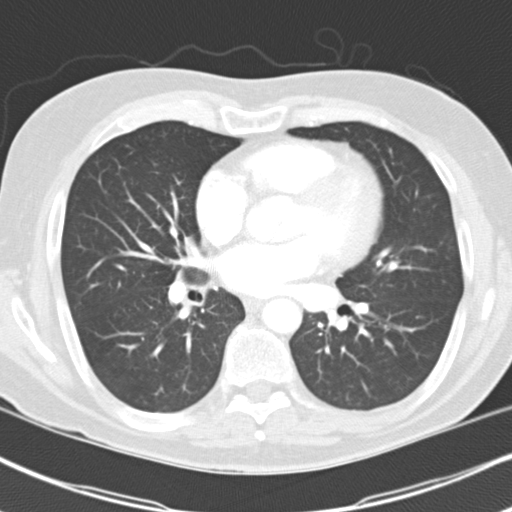
[im 65/98  mediastinal]
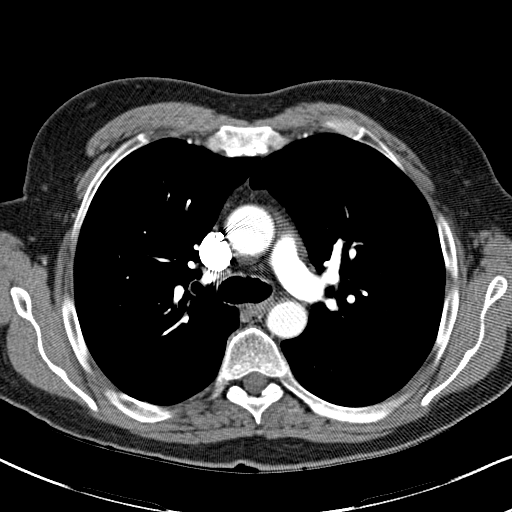
[im 81/98  lung]
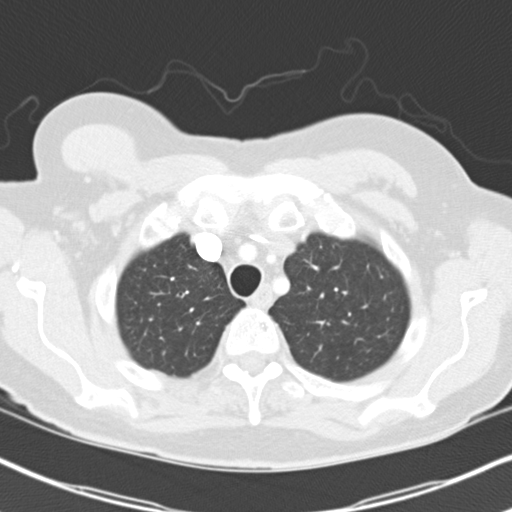

[Series 8: mpr coronal pe 3mm · coronal · 0.59mm/px · 1 of 78 slices shown]
[im 39/78  mediastinal]
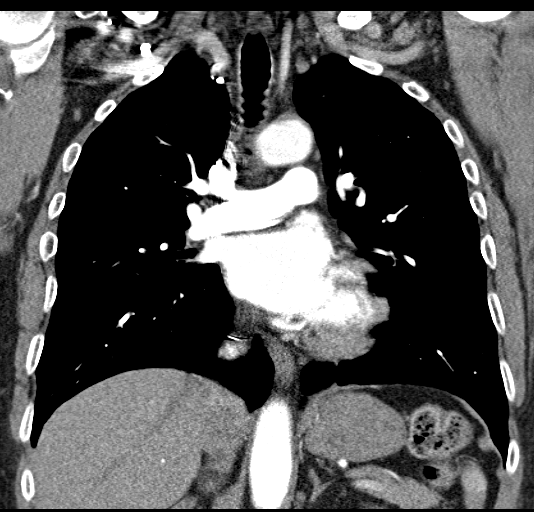

[6 of 36 positions shown; findings below may reference images not displayed]

FINDINGS: Satisfactory opacification of pulmonary arteries noted, and there is
no evidence of pulmonary emboli. Adequate contrast opacification of
the thoracic aorta with no evidence of dissection, aneurysm, or
stenosis. There is classic 3-vessel brachiocephalic arch anatomy
without proximal stenosis. Subcentimeter prevascular lymph nodes. No
hilar adenopathy. Coronary calcifications. Left subclavian AICD
extends towards the right ventricular apex. Focal areas of
thickening along the minor fissure stable. No new nodule or focal
infiltrate. Visualized portions of upper abdomen unremarkable.
Spondylitic changes in the mid and lower thoracic spine.

Review of the MIP images confirms the above findings.
IMPRESSION: 1. Negative for acute PE or thoracic aortic dissection.
2. Atherosclerosis, including coronary artery disease. Please note
that although the presence of coronary artery calcium documents the
presence of coronary artery disease, the severity of this disease
and any potential stenosis cannot be assessed on this non-gated CT
examination. Assessment for potential risk factor modification,
dietary therapy or pharmacologic therapy may be warranted, if
clinically indicated.

## 2014-02-22 ENCOUNTER — Telehealth: Payer: Self-pay | Admitting: Family Medicine

## 2014-02-22 ENCOUNTER — Other Ambulatory Visit: Payer: Self-pay | Admitting: Family Medicine

## 2014-02-22 MED ORDER — METOPROLOL SUCCINATE ER 25 MG PO TB24
12.5000 mg | ORAL_TABLET | Freq: Every day | ORAL | Status: DC
Start: 2014-02-22 — End: 2014-05-10

## 2014-02-22 MED ORDER — CLOPIDOGREL BISULFATE 75 MG PO TABS: ORAL_TABLET | ORAL | Status: DC

## 2014-02-22 MED ORDER — LANSOPRAZOLE 30 MG PO CPDR: DELAYED_RELEASE_CAPSULE | ORAL | Status: DC

## 2014-02-22 NOTE — Telephone Encounter (Signed)
Rx sent to [facility] for 30 day supply, also sent in 90 day supply of Rx's to patient's mail order pharmacy.  Patient aware.

## 2014-02-22 NOTE — Telephone Encounter (Signed)
Pts.mail order insurance company did not fax refill for 90 day supply of meds. He will be out tomorrow.  They are allowing him to get one -30 day supply for each needed RX at the pharmacy and then will fax refill request for 90 day supplies. Please call in lansoprazole, clopidogrel and metopolal to [facility] for the 30 day supply. Shelby Mattocks

## 2014-02-24 ENCOUNTER — Encounter: Payer: Self-pay | Admitting: Family Medicine

## 2014-02-24 ENCOUNTER — Ambulatory Visit (INDEPENDENT_AMBULATORY_CARE_PROVIDER_SITE_OTHER): Payer: Medicare Other | Admitting: Family Medicine

## 2014-02-24 VITALS — BP 99/64 | HR 61 | Temp 98.0°F | Resp 16

## 2014-02-24 DIAGNOSIS — I2510 Atherosclerotic heart disease of native coronary artery without angina pectoris: Secondary | ICD-10-CM | POA: Diagnosis not present

## 2014-02-24 DIAGNOSIS — E559 Vitamin D deficiency, unspecified: Secondary | ICD-10-CM

## 2014-02-24 DIAGNOSIS — Z23 Encounter for immunization: Secondary | ICD-10-CM

## 2014-02-24 LAB — VITAMIN D 25 HYDROXY (VIT D DEFICIENCY, FRACTURES): VITD: 33.05 ng/mL (ref 30.00–100.00)

## 2014-02-24 NOTE — Addendum Note (Signed)
Addended by: Ralph Dowdy on: 02/24/2014 10:53 AM   Modules accepted: Orders

## 2014-02-24 NOTE — Progress Notes (Signed)
OFFICE VISIT  02/24/2014   CC:  Chief Complaint  Patient presents with  . Follow-up   HPI:    Patient is a 68 y.o. Caucasian male who presents for 6 mo f/u anxiety and depression. Feeling well, taking effexor qd and xanax prn. He built a house in [facility] [place] [facility] and will be moving there in April 2016 but will keep his home here.  He'll be keeping his MD's here in the area.   He was found to have low vit D at most recent GI f/u with Dr. George Hugh about 5-6 mo ago and has been taking 2000 IU of vit D qd since then: now asks for vit D recheck.  The low vit D has been attributed to his daily intake of prevacid 30 qd.  He has followed up with cardiology and urology as well, since last time I saw him, and all was stable.  Past Medical History  Diagnosis Date  . Cardiomyopathy     EF about 30%  . CAD (coronary artery disease)     apical MI  . 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 ICD (implantable cardiac defibrillator) in place     ICD - Guidant  . Anxiety and depression   . 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 Cristina Gong)  . History of gastric ulcer 10/2010    Bx benign.  . Interstitial cystitis     Dr. Rosana Hoes  . History of diverticulitis of colon   . BPH (benign prostatic hypertrophy) 01/22/2012    Nocturia is his biggest issue   . Benign skin lesion     Chondrodermatitis nodularis (right superior helix) --Dr. Allyson Sabal  . Lynch syndrome     At higher risk for certain cancers: colon and bladder are the primary ones.  . Peyronie's disease     [facility]  . Lynch syndrome     MSH2 mutation    Past Surgical History  Procedure Laterality Date  . Cardiac defibrillator placement  5/07    Guidant ICD placement  . Cardiac defibrillator placement      [employer] - Remote - yes  . Abdominal surgery  2007     partial colectomy for colon cancer  . Colonoscopy      Normal 01/18/2012 (Dr. Cristina Gong)  . Coronary stent placement  2006  . Tonsillectomy  1972  . Esophagogastroduodenoscopy  05/2013    Normal except mild antral gastritis (this was done for screening purposes due to dx of Lynch syndrome)    Outpatient Prescriptions Prior to Visit  Medication Sig Dispense Refill  . ALPRAZolam (XANAX) 0.5 MG tablet Take 1-2 tablets (0.5-1 mg total) by mouth 2 (two) times daily as needed for anxiety. 60 tablet 2  . aspirin 81 MG EC tablet Take 81 mg by mouth daily. 1 tabs by mouth daily    . atorvastatin (LIPITOR) 20 MG tablet Take 1 tablet by mouth  daily 90 tablet 1  . Cholecalciferol 2000 UNITS TABS Take 2,000 Units by mouth daily.    . clopidogrel (PLAVIX) 75 MG tablet Take 1 tablet by mouth  daily 30 tablet 0  . Coenzyme Q10 (CO Q 10) 100 MG CAPS Take 2 capsules by mouth daily.     . lansoprazole (PREVACID) 30 MG capsule Take 1 capsule by mouth  daily 30 capsule 0  . metoprolol  succinate (TOPROL-XL) 25 MG 24 hr tablet Take one-half tablet by  mouth daily 45 tablet 1  . metoprolol succinate (TOPROL-XL) 25 MG 24 hr tablet Take 0.5 tablets (12.5 mg total) by mouth daily. 15 tablet 0  . Multiple Vitamins-Minerals (CENTRUM SILVER PO) Take 1 tablet by mouth daily.    . nitroGLYCERIN (NITROSTAT) 0.4 MG SL tablet Place 1 tablet (0.4 mg total) under the tongue every 5 (five) minutes as needed for chest pain. 15 tablet 2  . Omega-3 Fatty Acids (FISH OIL) 1200 MG CAPS Take 1 capsule by mouth daily.    Marland Kitchen venlafaxine XR (EFFEXOR-XR) 37.5 MG 24 hr capsule Take 1 capsule by mouth  daily 30 capsule 0  . albuterol (VENTOLIN HFA) 108 (90 BASE) MCG/ACT inhaler Inhale 2 puffs into the lungs every 4 (four) hours as needed for wheezing. 1 Inhaler 1  . Misc Natural Products (BLACK CHERRY CONCENTRATE PO) Take 1 capsule by mouth daily.     No facility-administered medications prior to visit.    Allergies  Allergen Reactions   . Promethazine Hcl Other (See Comments)    Possible hallucinations    ROS As per HPI  PE: Blood pressure 99/64, pulse 61, temperature 98 F (36.7 C), temperature source Temporal, resp. rate 16, SpO2 96 %. Gen: Alert, well appearing.  Patient is oriented to person, place, time, and situation. AFFECT: pleasant, lucid thought and speech. No further exam today.  LABS:  None today Recent: Lab Results  Component Value Date   WBC 4.6 07/28/2013   HGB 13.7 07/28/2013   HCT 41.0 07/28/2013   MCV 81.2 07/28/2013   PLT 268 07/28/2013     Chemistry      Component Value Date/Time   NA 139 07/28/2013 1112   K 4.9 07/28/2013 1112   CL 101 07/28/2013 1112   CO2 31 07/28/2013 1112   BUN 23 07/28/2013 1112   CREATININE 1.05 07/28/2013 1112   CREATININE 0.99 02/04/2013 1910      Component Value Date/Time   CALCIUM 9.6 07/28/2013 1112   ALKPHOS 50 07/28/2013 1112   AST 19 07/28/2013 1112   ALT 17 07/28/2013 1112   BILITOT 0.7 07/28/2013 1112     Lab Results  Component Value Date   TSH 1.350 07/28/2013   Lab Results  Component Value Date   CHOL 138 07/28/2013   HDL 72 07/28/2013   LDLCALC 58 07/28/2013   TRIG 38 07/28/2013   CHOLHDL 1.9 07/28/2013     IMPRESSION AND PLAN:  1) Chronic anxiety and depression: The current medical regimen is effective;  continue present plan and medications.  2) Vit D deficiency, secondary to chronic intake of prevacid--detected about 8 mo ago per pt when his GI MD at [place] tested for this.  We'll see what his level is today since taking 2000 IU vit D supplement daily since then.  3) Prev health care: prevnar 13 IM today  An After Visit Summary was printed and given to the patient.  FOLLOW UP: Return in about 6 months (around 08/25/2014) for Annual medicare wellness visit.

## 2014-02-24 NOTE — Progress Notes (Signed)
Pre visit review using our clinic review tool, if applicable. No additional management support is needed unless otherwise documented below in the visit note. 

## 2014-02-25 ENCOUNTER — Encounter: Payer: Self-pay | Admitting: Family Medicine

## 2014-03-10 ENCOUNTER — Ambulatory Visit (INDEPENDENT_AMBULATORY_CARE_PROVIDER_SITE_OTHER): Payer: Medicare Other | Admitting: Family Medicine

## 2014-03-10 ENCOUNTER — Encounter: Payer: Self-pay | Admitting: Family Medicine

## 2014-03-10 VITALS — BP 109/69 | HR 59 | Temp 97.6°F | Resp 18

## 2014-03-10 DIAGNOSIS — G4486 Cervicogenic headache: Secondary | ICD-10-CM

## 2014-03-10 DIAGNOSIS — R51 Headache: Secondary | ICD-10-CM | POA: Diagnosis not present

## 2014-03-10 DIAGNOSIS — I2510 Atherosclerotic heart disease of native coronary artery without angina pectoris: Secondary | ICD-10-CM

## 2014-03-10 NOTE — Progress Notes (Signed)
Pre visit review using our clinic review tool, if applicable. No additional management support is needed unless otherwise documented below in the visit note. 

## 2014-03-10 NOTE — Progress Notes (Signed)
OFFICE VISIT  03/10/2014   CC:  Chief Complaint  Patient presents with  . Headache   HPI:    Patient is a 68 y.o. Caucasian male who presents for headache.  Patient admits to being frustratingly vague with his description of how he feels. Started yesterday with mild pain "like a pinch" in back of head.  Says today he feels pressure in back of head, some in left forehead area, a bit in neck, some in mid back: all of this is intermittent today.  Nothing severe.  He has maintained his usual routine today, went and worked out at [employer] as usual and had no problem with this. Has procedure planned for tomorrow with oral surgeon in which he'll get some sedatives and he wants to make sure he'll be okay to proceed.  Throat not sore, no uri sx's, no fever/chills, no fatigue.  No cough. No problems with thinking, no speech issues, no focal weakness, no paresthesias.  No vision or hearing complaints.  No n/v or photo/phonophobia.   Has been getting sleep fine.  Admits he has history of having some mild left sided neck discomfort/stifness that has had a tendency to start a HA in the back of his head in the past.   Past Medical History  Diagnosis Date  . Cardiomyopathy     EF about 30%  . CAD (coronary artery disease)     apical MI  . 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 ICD (implantable cardiac defibrillator) in place     ICD - Guidant  . Anxiety and depression   . 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 Cristina Gong)  . History of gastric ulcer 10/2010    Bx benign.  . Interstitial cystitis     Dr. Rosana Hoes  . History of diverticulitis of colon   . BPH (benign prostatic hypertrophy) 01/22/2012    Nocturia is his biggest issue   . Benign skin lesion     Chondrodermatitis nodularis (right superior helix) --Dr. Allyson Sabal  . Lynch syndrome      MSH2 mutation: at higher risk for certain cancers: colon and bladder are the primary ones.  . Peyronie's disease     [facility]  . Vitamin D deficiency 2015    secondary to chronic prevacid intake.  2000 U vit D qd.    Past Surgical History  Procedure Laterality Date  . Cardiac defibrillator placement  5/07    Guidant ICD placement  . Cardiac defibrillator placement      [employer] - Remote - yes  . Abdominal surgery  2007    partial colectomy for colon cancer  . Colonoscopy      Normal 01/18/2012 (Dr. Cristina Gong)  . Coronary stent placement  2006  . Tonsillectomy  1972  . Esophagogastroduodenoscopy  05/2013    Normal except mild antral gastritis (this was done for screening purposes due to dx of Lynch syndrome)    Outpatient Prescriptions Prior to Visit  Medication Sig Dispense Refill  . ALPRAZolam (XANAX) 0.5 MG tablet Take 1-2 tablets (0.5-1 mg total) by mouth 2 (two) times daily as needed for anxiety. 60 tablet 2  . aspirin 81 MG EC tablet Take 81 mg by mouth daily. 1 tabs by mouth daily    . atorvastatin (LIPITOR) 20 MG tablet Take 1 tablet by mouth  daily  90 tablet 1  . Cholecalciferol 2000 UNITS TABS Take 2,000 Units by mouth daily.    . clopidogrel (PLAVIX) 75 MG tablet Take 1 tablet by mouth  daily 30 tablet 0  . Coenzyme Q10 (CO Q 10) 100 MG CAPS Take 2 capsules by mouth daily.     . lansoprazole (PREVACID) 30 MG capsule Take 1 capsule by mouth  daily 30 capsule 0  . metoprolol succinate (TOPROL-XL) 25 MG 24 hr tablet Take one-half tablet by  mouth daily 45 tablet 1  . metoprolol succinate (TOPROL-XL) 25 MG 24 hr tablet Take 0.5 tablets (12.5 mg total) by mouth daily. 15 tablet 0  . Multiple Vitamins-Minerals (CENTRUM SILVER PO) Take 1 tablet by mouth daily.    . nitroGLYCERIN (NITROSTAT) 0.4 MG SL tablet Place 1 tablet (0.4 mg total) under the tongue every 5 (five) minutes as needed for chest pain. 15 tablet 2  . Omega-3 Fatty Acids (FISH OIL) 1200 MG CAPS  Take 1 capsule by mouth daily.    Marland Kitchen venlafaxine XR (EFFEXOR-XR) 37.5 MG 24 hr capsule Take 1 capsule by mouth  daily 30 capsule 0   No facility-administered medications prior to visit.    Allergies  Allergen Reactions  . Promethazine Hcl Other (See Comments)    Possible hallucinations    ROS As per HPI  PE: Blood pressure 109/69, pulse 59, temperature 97.6 F (36.4 C), temperature source Oral, resp. rate 18, SpO2 98 %. Gen: Alert, well appearing.  Patient is oriented to person, place, time, and situation. AFFECT: pleasant, lucid thought and speech. TDH:RCBU: no injection, icteris, swelling, or exudate.  EOMI, PERRLA. Mouth: lips without lesion/swelling.  Oral mucosa pink and moist. Oropharynx without erythema, exudate, or swelling.  No neck or scalp tenderness.  No tenderness of the temples or sinus areas.  Neuro: CN 2-12 intact bilaterally, strength 5/5 in proximal and distal upper extremities and lower extremities bilaterally.  No sensory deficits.  No tremor.  No disdiadochokinesis.  No ataxia.  Upper extremity and lower extremity DTRs symmetric.  No pronator drift.   LABS:  none  IMPRESSION AND PLAN:  Headache, mild, mainly occipital.  Likely cervicogenic HA. Reassured him that entire exam is normal/reassuring.  He may proceed with his oral surgery tomorrow. Signs/symptoms to call or return for were reviewed and pt expressed understanding.  Spent 25 min with pt today, with >50% of this time spent in counseling and care coordination regarding the above problems.  An After Visit Summary was printed and given to the patient.  FOLLOW UP:  prn

## 2014-03-15 ENCOUNTER — Ambulatory Visit (INDEPENDENT_AMBULATORY_CARE_PROVIDER_SITE_OTHER): Payer: Medicare Other | Admitting: *Deleted

## 2014-03-15 DIAGNOSIS — I5022 Chronic systolic (congestive) heart failure: Secondary | ICD-10-CM

## 2014-03-15 DIAGNOSIS — Z95810 Presence of automatic (implantable) cardiac defibrillator: Secondary | ICD-10-CM

## 2014-03-15 LAB — MDC_IDC_ENUM_SESS_TYPE_REMOTE
Battery Remaining Percentage: 100 %
Brady Statistic RV Percent Paced: 0 %
Date Time Interrogation Session: 20151207070200
HighPow Impedance: 52 Ohm
Implantable Pulse Generator Serial Number: 123770
Lead Channel Setting Sensing Sensitivity: 0.5 mV
MDC IDC MSMT BATTERY REMAINING LONGEVITY: 144 mo
MDC IDC MSMT LEADCHNL RV IMPEDANCE VALUE: 691 Ohm
MDC IDC SET LEADCHNL RV PACING AMPLITUDE: 2.4 V
MDC IDC SET LEADCHNL RV PACING PULSEWIDTH: 0.4 ms
MDC IDC SET ZONE DETECTION INTERVAL: 250 ms
MDC IDC SET ZONE DETECTION INTERVAL: 300 ms
Zone Setting Detection Interval: 353 ms

## 2014-03-15 NOTE — Progress Notes (Signed)
Remote ICD transmission.   

## 2014-03-18 ENCOUNTER — Encounter (HOSPITAL_COMMUNITY): Payer: Self-pay | Admitting: Internal Medicine

## 2014-03-26 ENCOUNTER — Encounter: Payer: Self-pay | Admitting: Cardiology

## 2014-03-30 ENCOUNTER — Encounter: Payer: Self-pay | Admitting: Internal Medicine

## 2014-04-13 ENCOUNTER — Encounter: Payer: Self-pay | Admitting: Cardiology

## 2014-05-10 ENCOUNTER — Encounter: Payer: Self-pay | Admitting: Family Medicine

## 2014-05-10 ENCOUNTER — Ambulatory Visit (INDEPENDENT_AMBULATORY_CARE_PROVIDER_SITE_OTHER): Payer: Medicare Other | Admitting: Family Medicine

## 2014-05-10 VITALS — BP 92/60 | HR 67 | Temp 98.6°F | Resp 18

## 2014-05-10 DIAGNOSIS — M25562 Pain in left knee: Secondary | ICD-10-CM | POA: Diagnosis not present

## 2014-05-10 DIAGNOSIS — M25559 Pain in unspecified hip: Secondary | ICD-10-CM | POA: Diagnosis not present

## 2014-05-10 DIAGNOSIS — M25552 Pain in left hip: Secondary | ICD-10-CM | POA: Diagnosis not present

## 2014-05-10 DIAGNOSIS — R5383 Other fatigue: Secondary | ICD-10-CM

## 2014-05-10 DIAGNOSIS — M25569 Pain in unspecified knee: Secondary | ICD-10-CM | POA: Diagnosis not present

## 2014-05-10 DIAGNOSIS — M25561 Pain in right knee: Secondary | ICD-10-CM | POA: Diagnosis not present

## 2014-05-10 LAB — COMPREHENSIVE METABOLIC PANEL
ALK PHOS: 52 U/L (ref 39–117)
ALT: 19 U/L (ref 0–53)
AST: 21 U/L (ref 0–37)
Albumin: 4.4 g/dL (ref 3.5–5.2)
BUN: 26 mg/dL — AB (ref 6–23)
CALCIUM: 9.6 mg/dL (ref 8.4–10.5)
CO2: 32 mEq/L (ref 19–32)
Chloride: 101 mEq/L (ref 96–112)
Creatinine, Ser: 1.15 mg/dL (ref 0.40–1.50)
GFR: 67.32 mL/min (ref >60.00)
GLUCOSE: 70 mg/dL (ref 70–99)
Potassium: 4.9 mEq/L (ref 3.5–5.1)
Sodium: 138 mEq/L (ref 135–145)
Total Bilirubin: 0.7 mg/dL (ref 0.2–1.2)
Total Protein: 7 g/dL (ref 6.0–8.3)

## 2014-05-10 LAB — CBC WITH DIFFERENTIAL/PLATELET
Basophils Absolute: 0 10*3/uL (ref 0.0–0.1)
Basophils Relative: 0.4 % (ref 0.0–3.0)
EOS PCT: 1.7 % (ref 0.0–5.0)
Eosinophils Absolute: 0.1 10*3/uL (ref 0.0–0.7)
HCT: 41.5 % (ref 39.0–52.0)
Hemoglobin: 14.1 g/dL (ref 13.0–17.0)
Lymphocytes Relative: 23.4 % (ref 12.0–46.0)
Lymphs Abs: 1.8 10*3/uL (ref 0.7–4.0)
MCHC: 33.9 g/dL (ref 30.0–36.0)
MCV: 82 fl (ref 78.0–100.0)
MONOS PCT: 7.3 % (ref 3.0–12.0)
Monocytes Absolute: 0.6 10*3/uL (ref 0.1–1.0)
NEUTROS ABS: 5.2 10*3/uL (ref 1.4–7.7)
Neutrophils Relative %: 67.2 % (ref 43.0–77.0)
Platelets: 289 10*3/uL (ref 150.0–400.0)
RBC: 5.07 Mil/uL (ref 4.22–5.81)
RDW: 14.6 % (ref 11.5–15.5)
WBC: 7.7 10*3/uL (ref 4.0–10.5)

## 2014-05-10 LAB — SEDIMENTATION RATE: Sed Rate: 7 mm/hr (ref 0–22)

## 2014-05-10 LAB — TSH: TSH: 1.06 u[IU]/mL (ref 0.35–4.50)

## 2014-05-10 LAB — VITAMIN B12: VITAMIN B 12: 717 pg/mL (ref 211–911)

## 2014-05-10 NOTE — Progress Notes (Signed)
Pre visit review using our clinic review tool, if applicable. No additional management support is needed unless otherwise documented below in the visit note. 

## 2014-05-10 NOTE — Progress Notes (Signed)
OFFICE VISIT  05/10/2014   CC:  Chief Complaint  Patient presents with  . Fatigue    x 3 weeks  . Joint Pain   HPI:    Patient is a 69 y.o. Caucasian male who presents for fatigue. Pain in left hip, left knee, then right knee.  Doesn't feel sick.  Gets plenty of sleep.  No snoring.  Still feels able to do his workouts at the gym.  One day last week he had some upset/crampy stomach and had a loose BM, felt really tired, slept overnight and then felt well the next morning. Describes an ache, mild, intermittent in left hip lately, both knees in anterior aspects, worse when he first gets up to move around -some stiffness.  ROS: no fevers, no URI, says he feels like his eyes get tired easily towards the end of the day, denies acute vision changes.  No redness or swelling of joints.  No signif myalgias.  No CP or SOB.  No rash.  Denies melena or hematochezia.  No shoulder/neck pain or weakness, no UE weakness.  No wt loss.  Appetite is good.   Past Medical History  Diagnosis Date  . Cardiomyopathy     EF about 30%  . CAD (coronary artery disease)     apical MI  . 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 ICD (implantable cardiac defibrillator) in place     ICD - Guidant  . Anxiety and depression   . 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 Cristina Gong)  . History of gastric ulcer 10/2010    Bx benign.  . Interstitial cystitis     Dr. Rosana Hoes  . History of diverticulitis of colon   . BPH (benign prostatic hypertrophy) 01/22/2012    Nocturia is his biggest issue   . Benign skin lesion     Chondrodermatitis nodularis (right superior helix) --Dr. Allyson Sabal  . Lynch syndrome     MSH2 mutation: at higher risk for certain cancers: colon and bladder are the primary ones.  . Peyronie's disease     [facility]  . Vitamin D  deficiency 2015    secondary to chronic prevacid intake.  2000 U vit D qd.    Past Surgical History  Procedure Laterality Date  . Cardiac defibrillator placement  5/07    Guidant ICD placement  . Cardiac defibrillator placement      [employer] - Remote - yes  . Abdominal surgery  2007    partial colectomy for colon cancer  . Colonoscopy      Normal 01/18/2012 (Dr. Cristina Gong)  . Coronary stent placement  2006  . Tonsillectomy  1972  . Esophagogastroduodenoscopy  05/2013    Normal except mild antral gastritis (this was done for screening purposes due to dx of Lynch syndrome)  . Implantable cardioverter defibrillator (icd) generator change N/A 10/20/2012    Procedure: ICD GENERATOR CHANGE;  Surgeon: Evans Lance, MD;  Location: [facility] CATH LAB;  Service: Cardiovascular;  Laterality: N/A;    Outpatient Prescriptions Prior to Visit  Medication Sig Dispense Refill  . ALPRAZolam (XANAX) 0.5 MG tablet Take 1-2 tablets (0.5-1 mg total) by mouth 2 (two) times daily as needed for anxiety. 60 tablet 2  . aspirin 81 MG EC tablet Take 81 mg by mouth daily. 1 tabs by mouth daily    .  atorvastatin (LIPITOR) 20 MG tablet Take 1 tablet by mouth  daily 90 tablet 1  . Cholecalciferol 2000 UNITS TABS Take 2,000 Units by mouth daily.    . clopidogrel (PLAVIX) 75 MG tablet Take 1 tablet by mouth  daily 30 tablet 0  . Coenzyme Q10 (CO Q 10) 100 MG CAPS Take 2 capsules by mouth daily.     . lansoprazole (PREVACID) 30 MG capsule Take 1 capsule by mouth  daily 30 capsule 0  . metoprolol succinate (TOPROL-XL) 25 MG 24 hr tablet Take one-half tablet by  mouth daily 45 tablet 1  . Multiple Vitamins-Minerals (CENTRUM SILVER PO) Take 1 tablet by mouth daily.    . nitroGLYCERIN (NITROSTAT) 0.4 MG SL tablet Place 1 tablet (0.4 mg total) under the tongue every 5 (five) minutes as needed for chest pain. 15 tablet 2  . Omega-3 Fatty Acids (FISH OIL) 1200 MG CAPS Take 1 capsule by mouth daily.    Marland Kitchen venlafaxine XR  (EFFEXOR-XR) 37.5 MG 24 hr capsule Take 1 capsule by mouth  daily 30 capsule 0  . metoprolol succinate (TOPROL-XL) 25 MG 24 hr tablet Take 0.5 tablets (12.5 mg total) by mouth daily. 15 tablet 0   No facility-administered medications prior to visit.    Allergies  Allergen Reactions  . Promethazine Hcl Other (See Comments)    Possible hallucinations    ROS As per HPI  PE: Blood pressure 92/60, pulse 67, temperature 98.6 F (37 C), temperature source Temporal, resp. rate 18, SpO2 97 %. Gen: Alert, well appearing.  Patient is oriented to person, place, time, and situation. ACZ:YSAY: no injection, icteris, swelling, or exudate.  EOMI, PERRLA. Mouth: lips without lesion/swelling.  Oral mucosa pink and moist. Oropharynx without erythema, exudate, or swelling.  Neck - No masses or thyromegaly or limitation in range of motion CV: RRR, no m/r/g.   LUNGS: CTA bilat, nonlabored resps, good aeration in all lung fields. ABD: soft, NT, ND, BS normal.  No hepatospenomegaly or mass.  No bruits. EXT: no clubbing, cyanosis, or edema.  Skin - no sores or suspicious lesions or rashes or color changes Musculoskeletal: no joint swelling, erythema, warmth, or tenderness.  ROM of all joints intact. He has a mild amount of TTP over left greater troch region.  Hip ROM on left is not painful but "I can feel it" more than when tested on right per pt report. Knee exam today completely normal. Neuro: CN 2-12 intact bilaterally, strength 5/5 in proximal and distal upper extremities and lower extremities bilaterally.     No tremor.  No ataxia.    LABS: None today Recent labs: Lab Results  Component Value Date   WBC 7.7 05/10/2014   HGB 14.1 05/10/2014   HCT 41.5 05/10/2014   MCV 82.0 05/10/2014   PLT 289.0 05/10/2014   Lab Results  Component Value Date   TSH 1.06 05/10/2014     Chemistry      Component Value Date/Time   NA 138 05/10/2014 1338   K 4.9 05/10/2014 1338   CL 101 05/10/2014 1338    CO2 32 05/10/2014 1338   BUN 26* 05/10/2014 1338   CREATININE 1.15 05/10/2014 1338   CREATININE 1.05 07/28/2013 1112      Component Value Date/Time   CALCIUM 9.6 05/10/2014 1338   ALKPHOS 52 05/10/2014 1338   AST 21 05/10/2014 1338   ALT 19 05/10/2014 1338   BILITOT 0.7 05/10/2014 1338     Lab Results  Component Value Date  CHOL 138 07/28/2013   HDL 72 07/28/2013   LDLCALC 58 07/28/2013   TRIG 38 07/28/2013   CHOLHDL 1.9 07/28/2013     IMPRESSION AND PLAN:  Nonspecific fatigue.  Also with mild, intermittent joint complaints in left hip and both knees which seem c/w left troch bursitis and bilat mild patellar tendonitis on exam today. Tough to know how much to w/u here: in the end (after discussion with pt) decided to check CBC, CMET, TSH, vit B12, ESR, Parvo B19 and borrelia burg titers, and also get acetylcholine receptor ab (eval for myasthenia gravis).  No meds rx'd today. Pt has routine f/u with multiple sub-specialists coming up this spring regarding his Lynch syndrome.  An After Visit Summary was printed and given to the patient.  FOLLOW UP: to be determined based on results of w/u.

## 2014-05-11 LAB — B. BURGDORFI ANTIBODIES: B burgdorferi Ab IgG+IgM: 0.81 {ISR}

## 2014-05-12 LAB — PARVOVIRUS B19 ANTIBODY, IGG AND IGM
PAROVIRUS B19 IGM ABS: 0.1 {index} (ref <0.9)
Parovirus B19 IgG Abs: 3.6 index — ABNORMAL HIGH (ref <0.9)

## 2014-05-13 ENCOUNTER — Other Ambulatory Visit: Payer: Self-pay | Admitting: Family Medicine

## 2014-05-13 ENCOUNTER — Encounter: Payer: Self-pay | Admitting: Family Medicine

## 2014-05-14 LAB — ACETYLCHOLINE RECEPTOR AB, ALL

## 2014-05-25 ENCOUNTER — Ambulatory Visit (INDEPENDENT_AMBULATORY_CARE_PROVIDER_SITE_OTHER): Payer: Medicare Other | Admitting: Urology

## 2014-05-25 DIAGNOSIS — N400 Enlarged prostate without lower urinary tract symptoms: Secondary | ICD-10-CM

## 2014-05-25 DIAGNOSIS — R3915 Urgency of urination: Secondary | ICD-10-CM | POA: Diagnosis not present

## 2014-06-08 HISTORY — PX: TRANSTHORACIC ECHOCARDIOGRAM: SHX275

## 2014-06-10 ENCOUNTER — Other Ambulatory Visit: Payer: Self-pay | Admitting: *Deleted

## 2014-06-10 DIAGNOSIS — Z8679 Personal history of other diseases of the circulatory system: Secondary | ICD-10-CM

## 2014-06-14 ENCOUNTER — Encounter: Payer: Self-pay | Admitting: Internal Medicine

## 2014-06-14 ENCOUNTER — Ambulatory Visit (INDEPENDENT_AMBULATORY_CARE_PROVIDER_SITE_OTHER): Payer: Medicare Other | Admitting: Internal Medicine

## 2014-06-14 ENCOUNTER — Ambulatory Visit (HOSPITAL_COMMUNITY)
Admission: RE | Admit: 2014-06-14 | Discharge: 2014-06-14 | Disposition: A | Discharge status: Home / Self Care | Payer: Medicare Other | Source: Ambulatory Visit | Attending: Internal Medicine | Admitting: Internal Medicine

## 2014-06-14 VITALS — BP 118/70 | HR 60 | Ht 67.0 in | Wt 165.1 lb

## 2014-06-14 DIAGNOSIS — I5022 Chronic systolic (congestive) heart failure: Secondary | ICD-10-CM

## 2014-06-14 DIAGNOSIS — I509 Heart failure, unspecified: Secondary | ICD-10-CM | POA: Insufficient documentation

## 2014-06-14 DIAGNOSIS — Z8679 Personal history of other diseases of the circulatory system: Secondary | ICD-10-CM

## 2014-06-14 DIAGNOSIS — E785 Hyperlipidemia, unspecified: Secondary | ICD-10-CM | POA: Diagnosis not present

## 2014-06-14 DIAGNOSIS — H25812 Combined forms of age-related cataract, left eye: Secondary | ICD-10-CM | POA: Diagnosis not present

## 2014-06-14 MED ORDER — LISINOPRIL 2.5 MG PO TABS: 2.5000 mg | ORAL_TABLET | Freq: Every day | ORAL | Status: DC

## 2014-06-14 NOTE — Progress Notes (Signed)
  Echocardiogram 2D Echocardiogram has been performed.  Philipp Deputy 06/14/2014, 3:07 PM

## 2014-06-14 NOTE — Patient Instructions (Signed)
Your physician has recommended you make the following change in your medication:   START Lisinopril 2.5 mg by mouth daily  Your physician recommends that you return for lab work in: 1 week , BMET.  Your physician wants you to follow-up in: 6 month with Dr. Harrington Challenger. You will receive a reminder letter in the mail two months in advance. If you don't receive a letter, please call our office to schedule the follow-up appointment.

## 2014-06-14 NOTE — Progress Notes (Signed)
Cardiology Office Note   Date:  06/14/2014   ID:  Carter, Jake November 08, 1945, MRN [id number]  PCP:  Tammi Sou, MD  Cardiologist:   Dorris Carnes, MD   No chief complaint on file.     History of Present Illness: Jake Carter is a 69 y.o. male with a history of CAD (status post MI with stent to the LAD in 2005), perfusion study in 2007 which. He underwent repeat angiography that showed a 25% lesion before the stent and patent stent. 30% diagonal, otherwise, no significant disease. RCA was non-dominant. EF was 25-30%  I have followed him since then  Titration of meds has been difficult due to low BP  When I have added ACE I in the past he has become dizzy I saw the patient in September.   Since then he remains active  Walks on treadmill 30 min per day Breathing is OK  Weight is up  9 lbs  Watching what he eats more    Moving to [facility] in a few wks    Patinet does note achiness in joints  Worse in bed  Seen by Dr Anitra Lauth, serologic eval done.      Current Outpatient Prescriptions  Medication Sig Dispense Refill  . ALPRAZolam (XANAX) 0.5 MG tablet Take 1-2 tablets (0.5-1 mg total) by mouth 2 (two) times daily as needed for anxiety. 60 tablet 2  . aspirin 81 MG EC tablet Take 81 mg by mouth daily. 1 tabs by mouth daily    . atorvastatin (LIPITOR) 20 MG tablet Take 1 tablet by mouth  daily 90 tablet 1  . Cholecalciferol 2000 UNITS TABS Take 2,000 Units by mouth daily.    . clopidogrel (PLAVIX) 75 MG tablet Take 1 tablet by mouth  daily 30 tablet 0  . Coenzyme Q10 (CO Q 10) 100 MG CAPS Take 2 capsules by mouth daily.     . lansoprazole (PREVACID) 30 MG capsule Take 1 capsule by mouth  daily 30 capsule 0  . metoprolol succinate (TOPROL-XL) 25 MG 24 hr tablet Take one-half tablet by  mouth daily 45 tablet 1  . Multiple Vitamins-Minerals (CENTRUM SILVER PO) Take 1 tablet by mouth daily.    . nitroGLYCERIN (NITROSTAT) 0.4 MG SL tablet Place 1 tablet (0.4 mg total)  under the tongue every 5 (five) minutes as needed for chest pain. 15 tablet 2  . Omega-3 Fatty Acids (FISH OIL) 1200 MG CAPS Take 1 capsule by mouth daily.    Marland Kitchen RESVERATROL PO Take by mouth.    . TURMERIC PO Take by mouth daily.    Marland Kitchen venlafaxine XR (EFFEXOR-XR) 37.5 MG 24 hr capsule Take 1 capsule by mouth  daily 90 capsule 1  . vitamin E 400 UNIT capsule Take 400 Units by mouth daily.     No current facility-administered medications for this visit.    Allergies:   Promethazine hcl   Past Medical History  Diagnosis Date  . Cardiomyopathy     EF about 30%  . CAD (coronary artery disease)     apical MI  . 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 ICD (implantable cardiac defibrillator) in place     ICD - Guidant  . Anxiety and depression   . 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 Cristina Gong)  . History of gastric ulcer 10/2010  Bx benign.  . Interstitial cystitis     Dr. Rosana Hoes  . History of diverticulitis of colon   . BPH (benign prostatic hypertrophy) 01/22/2012    Nocturia is his biggest issue   . Benign skin lesion     Chondrodermatitis nodularis (right superior helix) --Dr. Allyson Sabal  . Lynch syndrome     MSH2 mutation: at higher risk for certain cancers: colon and bladder are the primary ones.  . Peyronie's disease     [facility]  . Vitamin D deficiency 2015    secondary to chronic prevacid intake.  2000 U vit D qd.    Past Surgical History  Procedure Laterality Date  . Cardiac defibrillator placement  5/07    Guidant ICD placement  . Cardiac defibrillator placement      [employer] - Remote - yes  . Abdominal surgery  2007    partial colectomy for colon cancer  . Colonoscopy      Normal 01/18/2012 (Dr. Cristina Gong)  . Coronary stent placement  2006  . Tonsillectomy  1972  .  Esophagogastroduodenoscopy  05/2013    Normal except mild antral gastritis (this was done for screening purposes due to dx of Lynch syndrome)  . Implantable cardioverter defibrillator (icd) generator change N/A 10/20/2012    Procedure: ICD GENERATOR CHANGE;  Surgeon: Evans Lance, MD;  Location: [facility] CATH LAB;  Service: Cardiovascular;  Laterality: N/A;     Social History:  The patient  reports that he quit smoking about 35 years ago. He has never used smokeless tobacco. He reports that he drinks alcohol. He reports that he does not use illicit drugs.   Family History:  The patient's family history includes Breast cancer (age of onset: 33) in his mother; Colon cancer (age of onset: 54) in his brother; Colon cancer (age of onset: 52) in his father; Colon cancer (age of onset: 72) in his paternal aunt; Colon cancer (age of onset: 44) in his paternal uncle; Colon cancer (age of onset: 14) in his paternal grandmother; Kidney cancer (age of onset: 90) in his paternal uncle; Uterine cancer in his paternal aunt; Uterine cancer (age of onset: 33) in his sister.    ROS:  Please see the history of present illness. All other systems are reviewed and  Negative to the above problem except as noted.    PHYSICAL EXAM: VS:  BP 118/70 mmHg  Pulse 60  Ht 5' 7"  (1.702 m)  Wt 165 lb 1.9 oz (74.898 kg)  BMI 25.86 kg/m2  GEN: Well nourished, well developed, in no acute distress HEENT: normal Neck: no JVD, carotid bruits, or masses Cardiac: RRR; no murmurs, rubs, or gallops,no edema  Respiratory:  clear to auscultation bilaterally, normal work of breathing GI: soft, nontender, nondistended, + BS  No hepatomegaly  MS: no deformity Moving all extremities   Skin: warm and dry, no rash Neuro:  Strength and sensation are intact Psych: euthymic mood, full affect   EKG:  EKG is ordered today.   Lipid Panel    Component Value Date/Time   CHOL 138 07/28/2013 1115   TRIG 38 07/28/2013 1115   HDL 72 07/28/2013  1115   CHOLHDL 1.9 07/28/2013 1115   VLDL 8 07/28/2013 1115   LDLCALC 58 07/28/2013 1115      Wt Readings from Last 3 Encounters:  06/14/14 165 lb 1.9 oz (74.898 kg)  12/18/13 156 lb (70.761 kg)  12/10/13 156 lb (70.761 kg)      ASSESSMENT AND PLAN:  1  Chronic systolic CHF  Volume status looks good  I have reviewed echo images  LVEF is unchanged at 25 to 30%   Since BP is a little higher than it has been I would try adding back low dose ACE I  Check labs in 10 day  2.  CAD  No symptoms of angina.  I would keep on current regimen given anatomy  3.  HL  Continue statin  LDL 58 in Feb  4.  Achiness.  Unusual Joints Not all the time  Worse in med.  He is active  It does not sound like a problem from statins  He can try stopping for a short bit to see if improves, but again, not typcial Labs recently showed an elev titer for parvo virus  WIll review with ID    Current medicines are reviewed at length with the patient today.  The patient does not have concerns regarding medicines.  The following changes have been made: Lisinoprl 1.25 mg per day    Labs/ tests ordered today include:  BMET in 10 days   No orders of the defined types were placed in this encounter.     Disposition:   FU with me in 6 months  He will come back periodically for doctor's visists  Signed, Dorris Carnes, MD  06/14/2014 4:21 PM    [facility] [address], [place], [place]  [address] Phone: [phone]; Fax: [phone]

## 2014-06-17 ENCOUNTER — Ambulatory Visit (INDEPENDENT_AMBULATORY_CARE_PROVIDER_SITE_OTHER): Payer: Medicare Other | Admitting: *Deleted

## 2014-06-17 ENCOUNTER — Other Ambulatory Visit (HOSPITAL_COMMUNITY): Payer: Medicare Other

## 2014-06-17 DIAGNOSIS — I429 Cardiomyopathy, unspecified: Secondary | ICD-10-CM

## 2014-06-17 LAB — MDC_IDC_ENUM_SESS_TYPE_REMOTE
Battery Remaining Percentage: 100 %
Brady Statistic RV Percent Paced: 0 %
HIGH POWER IMPEDANCE MEASURED VALUE: 61 Ohm
Lead Channel Setting Pacing Amplitude: 2.4 V
Lead Channel Setting Pacing Pulse Width: 0.4 ms
Lead Channel Setting Sensing Sensitivity: 0.5 mV
MDC IDC MSMT BATTERY REMAINING LONGEVITY: 144 mo
MDC IDC MSMT LEADCHNL RV IMPEDANCE VALUE: 810 Ohm
MDC IDC MSMT LEADCHNL RV PACING THRESHOLD AMPLITUDE: 0.9 V
MDC IDC MSMT LEADCHNL RV PACING THRESHOLD PULSEWIDTH: 0.4 ms
MDC IDC PG SERIAL: 123770
MDC IDC SESS DTM: 20160310070100
MDC IDC SET ZONE DETECTION INTERVAL: 250 ms
MDC IDC SET ZONE DETECTION INTERVAL: 300 ms
Zone Setting Detection Interval: 353 ms

## 2014-06-17 NOTE — Progress Notes (Signed)
Remote ICD transmission.   

## 2014-06-18 ENCOUNTER — Telehealth: Payer: Self-pay

## 2014-06-18 NOTE — Telephone Encounter (Signed)
Called patient to schedule lab for BMET, patient will be going to [facility] for labs. Patient also wanted to remind Dr. Harrington Challenger about checking on the elevated titer for parvo virus with ID. Will forward to Dr. Harrington Challenger.

## 2014-06-21 NOTE — Telephone Encounter (Signed)
Left msg for patient  Spoke to ID re parvo titer. Remote infection  They would not sched any other blood tests

## 2014-06-29 ENCOUNTER — Other Ambulatory Visit: Payer: Self-pay | Admitting: Internal Medicine

## 2014-06-29 DIAGNOSIS — I5022 Chronic systolic (congestive) heart failure: Secondary | ICD-10-CM | POA: Diagnosis not present

## 2014-06-30 DIAGNOSIS — H25812 Combined forms of age-related cataract, left eye: Secondary | ICD-10-CM | POA: Diagnosis not present

## 2014-06-30 LAB — BASIC METABOLIC PANEL
BUN: 27 mg/dL — ABNORMAL HIGH (ref 6–23)
CHLORIDE: 100 meq/L (ref 96–112)
CO2: 29 mEq/L (ref 19–32)
Calcium: 9.5 mg/dL (ref 8.4–10.5)
Creat: 1.08 mg/dL (ref 0.50–1.35)
Glucose, Bld: 85 mg/dL (ref 70–99)
POTASSIUM: 4.1 meq/L (ref 3.5–5.3)
Sodium: 137 mEq/L (ref 135–145)

## 2014-07-09 NOTE — Patient Instructions (Signed)
Your procedure is scheduled on: 07/19/2014  Report to [facility] at  68    AM.  Call this number if you have problems the morning of surgery: [phone]   Do not eat food or drink liquids :After Midnight.      Take these medicines the morning of surgery with A SIP OF WATER: xanax, prevacid, lisinopril, metoprolol, effexor   Do not wear jewelry, make-up or nail polish.  Do not wear lotions, powders, or perfumes.   Do not shave 48 hours prior to surgery.  Do not bring valuables to the hospital.  Contacts, dentures or bridgework may not be worn into surgery.  Leave suitcase in the car. After surgery it may be brought to your room.  For patients admitted to the hospital, checkout time is 11:00 AM the day of discharge.   Patients discharged the day of surgery will not be allowed to drive home.  :     Please read over the following fact sheets that you were given: Coughing and Deep Breathing, Surgical Site Infection Prevention, Anesthesia Post-op Instructions and Care and Recovery After Surgery    Cataract A cataract is a clouding of the lens of the eye. When a lens becomes cloudy, vision is reduced based on the degree and nature of the clouding. Many cataracts reduce vision to some degree. Some cataracts make people more near-sighted as they develop. Other cataracts increase glare. Cataracts that are ignored and become worse can sometimes look white. The white color can be seen through the pupil. CAUSES   Aging. However, cataracts may occur at any age, even in newborns.   Certain drugs.   Trauma to the eye.   Certain diseases such as diabetes.   Specific eye diseases such as chronic inflammation inside the eye or a sudden attack of a rare form of glaucoma.   Inherited or acquired medical problems.  SYMPTOMS   Gradual, progressive drop in vision in the affected eye.   Severe, rapid visual loss. This most often happens when trauma is the cause.  DIAGNOSIS  To detect a cataract, an  eye doctor examines the lens. Cataracts are best diagnosed with an exam of the eyes with the pupils enlarged (dilated) by drops.  TREATMENT  For an early cataract, vision may improve by using different eyeglasses or stronger lighting. If that does not help your vision, surgery is the only effective treatment. A cataract needs to be surgically removed when vision loss interferes with your everyday activities, such as driving, reading, or watching TV. A cataract may also have to be removed if it prevents examination or treatment of another eye problem. Surgery removes the cloudy lens and usually replaces it with a substitute lens (intraocular lens, IOL).  At a time when both you and your doctor agree, the cataract will be surgically removed. If you have cataracts in both eyes, only one is usually removed at a time. This allows the operated eye to heal and be out of danger from any possible problems after surgery (such as infection or poor wound healing). In rare cases, a cataract may be doing damage to your eye. In these cases, your caregiver may advise surgical removal right away. The vast majority of people who have cataract surgery have better vision afterward. HOME CARE INSTRUCTIONS  If you are not planning surgery, you may be asked to do the following:  Use different eyeglasses.   Use stronger or brighter lighting.   Ask your eye doctor about reducing your  medicine dose or changing medicines if it is thought that a medicine caused your cataract. Changing medicines does not make the cataract go away on its own.   Become familiar with your surroundings. Poor vision can lead to injury. Avoid bumping into things on the affected side. You are at a higher risk for tripping or falling.   Exercise extreme care when driving or operating machinery.   Wear sunglasses if you are sensitive to bright light or experiencing problems with glare.  SEEK IMMEDIATE MEDICAL CARE IF:   You have a worsening or sudden  vision loss.   You notice redness, swelling, or increasing pain in the eye.   You have a fever.  Document Released: 03/26/2005 Document Revised: 03/15/2011 Document Reviewed: 11/17/2010 [facility] Patient Information 2012 [place].PATIENT INSTRUCTIONS POST-ANESTHESIA  IMMEDIATELY FOLLOWING SURGERY:  Do not drive or operate machinery for the first twenty four hours after surgery.  Do not make any important decisions for twenty four hours after surgery or while taking narcotic pain medications or sedatives.  If you develop intractable nausea and vomiting or a severe headache please notify your doctor immediately.  FOLLOW-UP:  Please make an appointment with your surgeon as instructed. You do not need to follow up with anesthesia unless specifically instructed to do so.  WOUND CARE INSTRUCTIONS (if applicable):  Keep a dry clean dressing on the anesthesia/puncture wound site if there is drainage.  Once the wound has quit draining you may leave it open to air.  Generally you should leave the bandage intact for twenty four hours unless there is drainage.  If the epidural site drains for more than 36-48 hours please call the anesthesia department.  QUESTIONS?:  Please feel free to call your physician or the hospital operator if you have any questions, and they will be happy to assist you.

## 2014-07-12 ENCOUNTER — Other Ambulatory Visit: Payer: Self-pay

## 2014-07-12 ENCOUNTER — Encounter (HOSPITAL_COMMUNITY): Payer: Self-pay

## 2014-07-12 ENCOUNTER — Encounter (HOSPITAL_COMMUNITY)
Admission: RE | Admit: 2014-07-12 | Discharge: 2014-07-12 | Disposition: A | Discharge status: Home / Self Care | Payer: Medicare Other | Source: Ambulatory Visit | Attending: Ophthalmology | Admitting: Ophthalmology

## 2014-07-12 DIAGNOSIS — Z01812 Encounter for preprocedural laboratory examination: Secondary | ICD-10-CM | POA: Insufficient documentation

## 2014-07-12 DIAGNOSIS — Z01810 Encounter for preprocedural cardiovascular examination: Secondary | ICD-10-CM | POA: Insufficient documentation

## 2014-07-12 HISTORY — DX: Anxiety disorder, unspecified: F41.9

## 2014-07-12 HISTORY — DX: Heart failure, unspecified: I50.9

## 2014-07-12 HISTORY — DX: Acute myocardial infarction, unspecified: I21.9

## 2014-07-12 HISTORY — DX: Presence of automatic (implantable) cardiac defibrillator: Z95.810

## 2014-07-12 LAB — CBC
HCT: 39.6 % (ref 39.0–52.0)
Hemoglobin: 13.1 g/dL (ref 13.0–17.0)
MCH: 27.9 pg (ref 26.0–34.0)
MCHC: 33.1 g/dL (ref 30.0–36.0)
MCV: 84.4 fL (ref 78.0–100.0)
Platelets: 250 10*3/uL (ref 150–400)
RBC: 4.69 MIL/uL (ref 4.22–5.81)
RDW: 13.9 % (ref 11.5–15.5)
WBC: 4.5 10*3/uL (ref 4.0–10.5)

## 2014-07-12 LAB — BASIC METABOLIC PANEL
Anion gap: 7 (ref 5–15)
BUN: 26 mg/dL — ABNORMAL HIGH (ref 6–23)
CALCIUM: 9.3 mg/dL (ref 8.4–10.5)
CO2: 29 mmol/L (ref 19–32)
CREATININE: 1.07 mg/dL (ref 0.50–1.35)
Chloride: 102 mmol/L (ref 96–112)
GFR calc Af Amer: 81 mL/min — ABNORMAL LOW (ref >90)
GFR calc non Af Amer: 70 mL/min — ABNORMAL LOW (ref >90)
Glucose, Bld: 97 mg/dL (ref 70–99)
Potassium: 4.8 mmol/L (ref 3.5–5.1)
Sodium: 138 mmol/L (ref 135–145)

## 2014-07-14 ENCOUNTER — Encounter: Payer: Self-pay | Admitting: Cardiology

## 2014-07-16 MED ORDER — NEOMYCIN-POLYMYXIN-DEXAMETH 3.5-10000-0.1 OP SUSP
OPHTHALMIC | Status: AC
  Filled 2014-07-16: qty 5

## 2014-07-16 MED ORDER — TETRACAINE HCL 0.5 % OP SOLN
OPHTHALMIC | Status: AC
  Filled 2014-07-16: qty 2

## 2014-07-16 MED ORDER — PHENYLEPHRINE HCL 2.5 % OP SOLN
OPHTHALMIC | Status: AC
  Filled 2014-07-16: qty 15

## 2014-07-16 MED ORDER — CYCLOPENTOLATE-PHENYLEPHRINE OP SOLN OPTIME - NO CHARGE
OPHTHALMIC | Status: AC
  Filled 2014-07-16: qty 2

## 2014-07-16 MED ORDER — LIDOCAINE HCL (PF) 1 % IJ SOLN
INTRAMUSCULAR | Status: AC
  Filled 2014-07-16: qty 2

## 2014-07-16 MED ORDER — LIDOCAINE HCL 3.5 % OP GEL
OPHTHALMIC | Status: AC
  Filled 2014-07-16: qty 1

## 2014-07-19 ENCOUNTER — Ambulatory Visit (HOSPITAL_COMMUNITY): Payer: Medicare Other | Admitting: Anesthesiology

## 2014-07-19 ENCOUNTER — Ambulatory Visit: Payer: Medicare Other | Admitting: Internal Medicine

## 2014-07-19 ENCOUNTER — Encounter (HOSPITAL_COMMUNITY): Payer: Self-pay | Admitting: *Deleted

## 2014-07-19 ENCOUNTER — Ambulatory Visit (HOSPITAL_COMMUNITY)
Admission: RE | Admit: 2014-07-19 | Discharge: 2014-07-19 | Disposition: A | Discharge status: Home / Self Care | Payer: Medicare Other | Source: Ambulatory Visit | Attending: Ophthalmology | Admitting: Ophthalmology

## 2014-07-19 ENCOUNTER — Encounter (HOSPITAL_COMMUNITY)
Admission: RE | Disposition: A | Discharge status: Home / Self Care | Payer: Self-pay | Source: Ambulatory Visit | Attending: Ophthalmology

## 2014-07-19 DIAGNOSIS — I2510 Atherosclerotic heart disease of native coronary artery without angina pectoris: Secondary | ICD-10-CM | POA: Diagnosis not present

## 2014-07-19 DIAGNOSIS — Z87891 Personal history of nicotine dependence: Secondary | ICD-10-CM | POA: Insufficient documentation

## 2014-07-19 DIAGNOSIS — Z955 Presence of coronary angioplasty implant and graft: Secondary | ICD-10-CM | POA: Insufficient documentation

## 2014-07-19 DIAGNOSIS — H259 Unspecified age-related cataract: Secondary | ICD-10-CM | POA: Diagnosis not present

## 2014-07-19 DIAGNOSIS — H25812 Combined forms of age-related cataract, left eye: Secondary | ICD-10-CM | POA: Insufficient documentation

## 2014-07-19 DIAGNOSIS — Z95810 Presence of automatic (implantable) cardiac defibrillator: Secondary | ICD-10-CM | POA: Insufficient documentation

## 2014-07-19 DIAGNOSIS — K219 Gastro-esophageal reflux disease without esophagitis: Secondary | ICD-10-CM | POA: Diagnosis not present

## 2014-07-19 DIAGNOSIS — I252 Old myocardial infarction: Secondary | ICD-10-CM | POA: Insufficient documentation

## 2014-07-19 DIAGNOSIS — I509 Heart failure, unspecified: Secondary | ICD-10-CM | POA: Insufficient documentation

## 2014-07-19 DIAGNOSIS — H269 Unspecified cataract: Secondary | ICD-10-CM | POA: Diagnosis present

## 2014-07-19 HISTORY — PX: CATARACT EXTRACTION W/PHACO: SHX586

## 2014-07-19 SURGERY — PHACOEMULSIFICATION, CATARACT, WITH IOL INSERTION
Anesthesia: Monitor Anesthesia Care | Site: Eye | Laterality: Left

## 2014-07-19 MED ORDER — EPINEPHRINE HCL 1 MG/ML IJ SOLN
INTRAOCULAR | Status: DC | PRN
  Administered 2014-07-19: 500 mL

## 2014-07-19 MED ORDER — POVIDONE-IODINE 5 % OP SOLN
OPHTHALMIC | Status: DC | PRN
  Administered 2014-07-19: 1 via OPHTHALMIC

## 2014-07-19 MED ORDER — PROPOFOL 10 MG/ML IV BOLUS
INTRAVENOUS | Status: AC
  Filled 2014-07-19: qty 20

## 2014-07-19 MED ORDER — LIDOCAINE HCL (PF) 1 % IJ SOLN
INTRAMUSCULAR | Status: DC | PRN
  Administered 2014-07-19: .6 mL

## 2014-07-19 MED ORDER — PROPOFOL INFUSION 10 MG/ML OPTIME: INTRAVENOUS | Status: DC | PRN

## 2014-07-19 MED ORDER — FENTANYL CITRATE 0.05 MG/ML IJ SOLN
25.0000 ug | INTRAMUSCULAR | Status: AC
  Administered 2014-07-19: 25 ug via INTRAVENOUS

## 2014-07-19 MED ORDER — CYCLOPENTOLATE-PHENYLEPHRINE 0.2-1 % OP SOLN
1.0000 [drp] | OPHTHALMIC | Status: AC
  Administered 2014-07-19 (×3): 1 [drp] via OPHTHALMIC

## 2014-07-19 MED ORDER — PROPOFOL 10 MG/ML IV BOLUS
INTRAVENOUS | Status: DC | PRN
  Administered 2014-07-19: 10 mg via INTRAVENOUS
  Administered 2014-07-19: 5 mg via INTRAVENOUS

## 2014-07-19 MED ORDER — PHENYLEPHRINE HCL 2.5 % OP SOLN
1.0000 [drp] | OPHTHALMIC | Status: AC
  Administered 2014-07-19 (×3): 1 [drp] via OPHTHALMIC

## 2014-07-19 MED ORDER — MIDAZOLAM HCL 2 MG/2ML IJ SOLN
1.0000 mg | INTRAMUSCULAR | Status: AC | PRN
  Administered 2014-07-19 (×3): 2 mg via INTRAVENOUS

## 2014-07-19 MED ORDER — LIDOCAINE HCL 3.5 % OP GEL
1.0000 "application " | Freq: Once | OPHTHALMIC | Status: AC
  Administered 2014-07-19: 1 via OPHTHALMIC

## 2014-07-19 MED ORDER — BSS IO SOLN
INTRAOCULAR | Status: DC | PRN
  Administered 2014-07-19: 15 mL

## 2014-07-19 MED ORDER — MIDAZOLAM HCL 2 MG/2ML IJ SOLN
INTRAMUSCULAR | Status: AC
  Filled 2014-07-19: qty 2

## 2014-07-19 MED ORDER — NEOMYCIN-POLYMYXIN-DEXAMETH 3.5-10000-0.1 OP SUSP
OPHTHALMIC | Status: DC | PRN
  Administered 2014-07-19: 1 [drp] via OPHTHALMIC

## 2014-07-19 MED ORDER — LACTATED RINGERS IV SOLN
INTRAVENOUS | Status: DC
  Administered 2014-07-19: 1000 mL via INTRAVENOUS

## 2014-07-19 MED ORDER — NA HYALUR & NA CHOND-NA HYALUR 0.55-0.5 ML IO KIT
PACK | INTRAOCULAR | Status: DC | PRN
  Administered 2014-07-19: 1 via OPHTHALMIC

## 2014-07-19 MED ORDER — LACTATED RINGERS IV SOLN
INTRAVENOUS | Status: DC | PRN
  Administered 2014-07-19: 10:00:00 via INTRAVENOUS

## 2014-07-19 MED ORDER — EPINEPHRINE HCL 1 MG/ML IJ SOLN
INTRAMUSCULAR | Status: AC
Start: 2014-07-19 — End: 2014-07-19
  Filled 2014-07-19: qty 1

## 2014-07-19 MED ORDER — FENTANYL CITRATE 0.05 MG/ML IJ SOLN
INTRAMUSCULAR | Status: AC
  Filled 2014-07-19: qty 2

## 2014-07-19 MED ORDER — TETRACAINE HCL 0.5 % OP SOLN
1.0000 [drp] | OPHTHALMIC | Status: AC
  Administered 2014-07-19 (×3): 1 [drp] via OPHTHALMIC

## 2014-07-19 SURGICAL SUPPLY — 12 items
CLOTH BEACON ORANGE TIMEOUT ST (SAFETY) ×2 IMPLANT
EYE SHIELD UNIVERSAL CLEAR (GAUZE/BANDAGES/DRESSINGS) ×2 IMPLANT
GLOVE BIOGEL PI IND STRL 7.0 (GLOVE) IMPLANT
GLOVE BIOGEL PI INDICATOR 7.0 (GLOVE) ×2
GLOVE EXAM NITRILE MD LF STRL (GLOVE) ×2 IMPLANT
LENS INTRAOCULAR RESTOR ×2 IMPLANT
PAD ARMBOARD 7.5X6 YLW CONV (MISCELLANEOUS) ×2 IMPLANT
PROC W SPEC LENS (INTRAOCULAR LENS) ×3
PROCESS W SPEC LENS (INTRAOCULAR LENS) IMPLANT
SYRINGE LUER LOK 1CC (MISCELLANEOUS) ×2 IMPLANT
TAPE TRANSPORE STRL 2 31045 (GAUZE/BANDAGES/DRESSINGS) ×2 IMPLANT
WATER STERILE IRR 250ML POUR (IV SOLUTION) ×2 IMPLANT

## 2014-07-19 NOTE — Discharge Instructions (Signed)

## 2014-07-19 NOTE — Transfer of Care (Signed)
Immediate Anesthesia Transfer of Care Note  Patient: Jake Carter  Procedure(s) Performed: Procedure(s) with comments: CATARACT EXTRACTION PHACO AND INTRAOCULAR LENS PLACEMENT (IOC) (Left) - CDE:16.86  Patient Location: Short Stay  Anesthesia Type:MAC  Level of Consciousness: awake, alert , oriented and patient cooperative  Airway & Oxygen Therapy: Patient Spontanous Breathing  Post-op Assessment: Report given to RN and Post -op Vital signs reviewed and stable  Post vital signs: Reviewed and stable  Last Vitals:  Filed Vitals:   07/19/14 1025  BP: 129/68  Resp: 48    Complications: No apparent anesthesia complications

## 2014-07-19 NOTE — Op Note (Signed)
Date of Admission: 07/19/2014  Date of Surgery: 07/19/2014  Pre-Op Dx: Cataract Left  Eye  Post-Op Dx: Senile Combined Cataract  Left  Eye,  Dx Code X64.680  Surgeon: Tonny Branch, M.D.  Assistants: None  Anesthesia: Topical with MAC  Indications: Painless, progressive loss of vision with compromise of daily activities.  Surgery: Cataract Extraction with Intraocular lens Implant Left Eye  Discription: The patient had dilating drops and viscous lidocaine placed into the Left eye in the pre-op holding area. After transfer to the operating room, a time out was performed. The patient was then prepped and draped. Beginning with a 29 degree blade a paracentesis port was made at the surgeon's 2 o'clock position. The anterior chamber was then filled with 1% non-preserved lidocaine. This was followed by filling the anterior chamber with Provisc.  A 2.36mm keratome blade was used to make a clear corneal incision at the temporal limbus.  A bent cystatome needle was used to create a continuous tear capsulotomy. Hydrodissection was performed with balanced salt solution on a Fine canula. The lens nucleus was then removed using the phacoemulsification handpiece. Residual cortex was removed with the I&A handpiece. The anterior chamber and capsular bag were refilled with Provisc. A posterior chamber intraocular lens was placed into the capsular bag with it's injector. The implant was positioned with the Kuglan hook. The Provisc was then removed from the anterior chamber and capsular bag with the I&A handpiece. Stromal hydration of the main incision and paracentesis port was performed with BSS on a Fine canula. The wounds were tested for leak which was negative. The patient tolerated the procedure well. There were no operative complications. The patient was then transferred to the recovery room in stable condition.  Complications: None  Specimen: None  EBL: None  Prosthetic device: Alcon AcrySof ReStor [id number],  power 22.0 D, SN [id number].

## 2014-07-19 NOTE — Anesthesia Procedure Notes (Signed)
Procedure Name: MAC Date/Time: 07/19/2014 10:27 AM Performed by: Andree Elk, Victoriana Aziz A Pre-anesthesia Checklist: Patient identified, Timeout performed, Emergency Drugs available, Suction available and Patient being monitored Oxygen Delivery Method: Nasal cannula

## 2014-07-19 NOTE — H&P (Signed)
I have reviewed the H&P, the patient was re-examined, and I have identified no interval changes in medical condition and plan of care since the history and physical of record  

## 2014-07-19 NOTE — Anesthesia Preprocedure Evaluation (Signed)
Anesthesia Evaluation  Patient identified by MRN, date of birth, ID band Patient awake    Reviewed: Allergy & Precautions, NPO status , Patient's Chart, lab work & pertinent test results, reviewed documented beta blocker date and time   Airway Mallampati: II  TM Distance: >3 FB     Dental  (+) Teeth Intact   Pulmonary former smoker,  breath sounds clear to auscultation        Cardiovascular + CAD, + Past MI, + Cardiac Stents and +CHF + dysrhythmias (NSVT) + Cardiac Defibrillator Rhythm:Regular Rate:Bradycardia     Neuro/Psych PSYCHIATRIC DISORDERS Anxiety    GI/Hepatic GERD-  Controlled and Medicated,  Endo/Other    Renal/GU      Musculoskeletal   Abdominal   Peds  Hematology   Anesthesia Other Findings   Reproductive/Obstetrics                             Anesthesia Physical Anesthesia Plan  ASA: III  Anesthesia Plan: MAC   Post-op Pain Management:    Induction: Intravenous  Airway Management Planned: Nasal Cannula  Additional Equipment:   Intra-op Plan:   Post-operative Plan:   Informed Consent: I have reviewed the patients History and Physical, chart, labs and discussed the procedure including the risks, benefits and alternatives for the proposed anesthesia with the patient or authorized representative who has indicated his/her understanding and acceptance.     Plan Discussed with:   Anesthesia Plan Comments:         Anesthesia Quick Evaluation

## 2014-07-19 NOTE — Anesthesia Postprocedure Evaluation (Signed)
  Anesthesia Post-op Note  Patient: Jake Carter  Procedure(s) Performed: Procedure(s) with comments: CATARACT EXTRACTION PHACO AND INTRAOCULAR LENS PLACEMENT (IOC) (Left) - CDE:16.86  Patient Location: Short Stay  Anesthesia Type:MAC  Level of Consciousness: awake, alert , oriented and patient cooperative  Airway and Oxygen Therapy: Patient Spontanous Breathing  Post-op Pain: none  Post-op Assessment: Post-op Vital signs reviewed, Patient's Cardiovascular Status Stable, Respiratory Function Stable, Patent Airway, No signs of Nausea or vomiting and Pain level controlled  Post-op Vital Signs: Reviewed and stable  Last Vitals:  Filed Vitals:   07/19/14 1025  BP: 129/68  Resp: 48    Complications: No apparent anesthesia complications

## 2014-07-20 ENCOUNTER — Encounter: Payer: Self-pay | Admitting: Internal Medicine

## 2014-07-20 ENCOUNTER — Encounter (HOSPITAL_COMMUNITY): Payer: Self-pay | Admitting: Ophthalmology

## 2014-07-26 DIAGNOSIS — H2511 Age-related nuclear cataract, right eye: Secondary | ICD-10-CM | POA: Diagnosis not present

## 2014-07-27 ENCOUNTER — Encounter (HOSPITAL_COMMUNITY): Payer: Self-pay

## 2014-07-27 ENCOUNTER — Encounter (HOSPITAL_COMMUNITY)
Admission: RE | Admit: 2014-07-27 | Discharge: 2014-07-27 | Disposition: A | Discharge status: Home / Self Care | Payer: Medicare Other | Source: Ambulatory Visit | Attending: Ophthalmology | Admitting: Ophthalmology

## 2014-07-27 NOTE — Patient Instructions (Signed)
Pre op assessment completed at this time.  Arrival time verified and is 8:00.  Patient verbalized understanding and denied having any questions.  States that he was not taken off of plavix or ASA with his previous cataract surgery, earlier this month.  Will not take it am of procedure, as directed.

## 2014-07-30 MED ORDER — CYCLOPENTOLATE-PHENYLEPHRINE OP SOLN OPTIME - NO CHARGE
OPHTHALMIC | Status: AC
  Filled 2014-07-30: qty 2

## 2014-07-30 MED ORDER — LIDOCAINE HCL 3.5 % OP GEL
OPHTHALMIC | Status: AC
  Filled 2014-07-30: qty 1

## 2014-07-30 MED ORDER — TETRACAINE HCL 0.5 % OP SOLN
OPHTHALMIC | Status: AC
  Filled 2014-07-30: qty 2

## 2014-07-30 MED ORDER — NEOMYCIN-POLYMYXIN-DEXAMETH 3.5-10000-0.1 OP SUSP
OPHTHALMIC | Status: AC
  Filled 2014-07-30: qty 5

## 2014-07-30 MED ORDER — PHENYLEPHRINE HCL 2.5 % OP SOLN
OPHTHALMIC | Status: AC
  Filled 2014-07-30: qty 15

## 2014-07-30 MED ORDER — LIDOCAINE HCL (PF) 1 % IJ SOLN
INTRAMUSCULAR | Status: AC
  Filled 2014-07-30: qty 2

## 2014-08-02 ENCOUNTER — Ambulatory Visit (HOSPITAL_COMMUNITY)
Admission: RE | Admit: 2014-08-02 | Discharge: 2014-08-02 | Disposition: A | Discharge status: Home / Self Care | Payer: Medicare Other | Source: Ambulatory Visit | Attending: Ophthalmology | Admitting: Ophthalmology

## 2014-08-02 ENCOUNTER — Ambulatory Visit (HOSPITAL_COMMUNITY): Payer: Medicare Other | Admitting: Anesthesiology

## 2014-08-02 ENCOUNTER — Encounter (HOSPITAL_COMMUNITY)
Admission: RE | Disposition: A | Discharge status: Home / Self Care | Payer: Self-pay | Source: Ambulatory Visit | Attending: Ophthalmology

## 2014-08-02 ENCOUNTER — Encounter (HOSPITAL_COMMUNITY): Payer: Self-pay | Admitting: *Deleted

## 2014-08-02 DIAGNOSIS — H2511 Age-related nuclear cataract, right eye: Secondary | ICD-10-CM | POA: Diagnosis not present

## 2014-08-02 DIAGNOSIS — H259 Unspecified age-related cataract: Secondary | ICD-10-CM | POA: Diagnosis not present

## 2014-08-02 HISTORY — PX: CATARACT EXTRACTION W/PHACO: SHX586

## 2014-08-02 SURGERY — PHACOEMULSIFICATION, CATARACT, WITH IOL INSERTION
Anesthesia: Monitor Anesthesia Care | Site: Eye | Laterality: Right

## 2014-08-02 MED ORDER — LACTATED RINGERS IV SOLN
INTRAVENOUS | Status: DC
  Administered 2014-08-02: 09:00:00 via INTRAVENOUS

## 2014-08-02 MED ORDER — PROVISC 10 MG/ML IO SOLN
INTRAOCULAR | Status: DC | PRN
  Administered 2014-08-02: 0.85 mL via INTRAOCULAR

## 2014-08-02 MED ORDER — EPINEPHRINE HCL 1 MG/ML IJ SOLN
INTRAOCULAR | Status: DC | PRN
  Administered 2014-08-02: 500 mL

## 2014-08-02 MED ORDER — POVIDONE-IODINE 5 % OP SOLN
OPHTHALMIC | Status: DC | PRN
  Administered 2014-08-02: 1 via OPHTHALMIC

## 2014-08-02 MED ORDER — FENTANYL CITRATE (PF) 100 MCG/2ML IJ SOLN
25.0000 ug | INTRAMUSCULAR | Status: AC
  Administered 2014-08-02 (×2): 25 ug via INTRAVENOUS

## 2014-08-02 MED ORDER — EPINEPHRINE HCL 1 MG/ML IJ SOLN
INTRAMUSCULAR | Status: AC
  Filled 2014-08-02: qty 1

## 2014-08-02 MED ORDER — BSS IO SOLN
INTRAOCULAR | Status: DC | PRN
  Administered 2014-08-02: 15 mL

## 2014-08-02 MED ORDER — PHENYLEPHRINE HCL 2.5 % OP SOLN
1.0000 [drp] | OPHTHALMIC | Status: AC
  Administered 2014-08-02 (×3): 1 [drp] via OPHTHALMIC

## 2014-08-02 MED ORDER — TETRACAINE HCL 0.5 % OP SOLN
1.0000 [drp] | OPHTHALMIC | Status: AC
  Administered 2014-08-02 (×3): 1 [drp] via OPHTHALMIC

## 2014-08-02 MED ORDER — MIDAZOLAM HCL 2 MG/2ML IJ SOLN
1.0000 mg | INTRAMUSCULAR | Status: AC | PRN
  Administered 2014-08-02 (×3): 2 mg via INTRAVENOUS
  Filled 2014-08-02 (×2): qty 2

## 2014-08-02 MED ORDER — NEOMYCIN-POLYMYXIN-DEXAMETH 3.5-10000-0.1 OP SUSP
OPHTHALMIC | Status: DC | PRN
  Administered 2014-08-02: 2 [drp] via OPHTHALMIC

## 2014-08-02 MED ORDER — FENTANYL CITRATE (PF) 100 MCG/2ML IJ SOLN
INTRAMUSCULAR | Status: AC
  Filled 2014-08-02: qty 2

## 2014-08-02 MED ORDER — PROPOFOL 10 MG/ML IV BOLUS
INTRAVENOUS | Status: AC
  Filled 2014-08-02: qty 20

## 2014-08-02 MED ORDER — LIDOCAINE HCL 3.5 % OP GEL
1.0000 "application " | Freq: Once | OPHTHALMIC | Status: AC
  Administered 2014-08-02: 1 via OPHTHALMIC

## 2014-08-02 MED ORDER — PROPOFOL 10 MG/ML IV BOLUS
INTRAVENOUS | Status: DC | PRN
  Administered 2014-08-02 (×2): 10 mg via INTRAVENOUS
  Administered 2014-08-02: 5 mg via INTRAVENOUS

## 2014-08-02 MED ORDER — CYCLOPENTOLATE-PHENYLEPHRINE 0.2-1 % OP SOLN
1.0000 [drp] | OPHTHALMIC | Status: AC
  Administered 2014-08-02 (×3): 1 [drp] via OPHTHALMIC

## 2014-08-02 MED ORDER — MIDAZOLAM HCL 2 MG/2ML IJ SOLN
INTRAMUSCULAR | Status: AC
  Filled 2014-08-02: qty 2

## 2014-08-02 MED ORDER — LIDOCAINE HCL (PF) 1 % IJ SOLN
INTRAMUSCULAR | Status: DC | PRN
  Administered 2014-08-02: .3 mL

## 2014-08-02 SURGICAL SUPPLY — 34 items
CAPSULAR TENSION RING-AMO (OPHTHALMIC RELATED) IMPLANT
CLOTH BEACON ORANGE TIMEOUT ST (SAFETY) ×2 IMPLANT
EYE SHIELD UNIVERSAL CLEAR (GAUZE/BANDAGES/DRESSINGS) ×2 IMPLANT
GLOVE BIO SURGEON STRL SZ 6.5 (GLOVE) IMPLANT
GLOVE BIO SURGEONS STRL SZ 6.5 (GLOVE)
GLOVE BIOGEL PI IND STRL 6.5 (GLOVE) IMPLANT
GLOVE BIOGEL PI IND STRL 7.0 (GLOVE) IMPLANT
GLOVE BIOGEL PI IND STRL 7.5 (GLOVE) IMPLANT
GLOVE BIOGEL PI INDICATOR 6.5 (GLOVE) ×2
GLOVE BIOGEL PI INDICATOR 7.0 (GLOVE)
GLOVE BIOGEL PI INDICATOR 7.5 (GLOVE)
GLOVE ECLIPSE 6.5 STRL STRAW (GLOVE) IMPLANT
GLOVE ECLIPSE 7.0 STRL STRAW (GLOVE) IMPLANT
GLOVE ECLIPSE 7.5 STRL STRAW (GLOVE) IMPLANT
GLOVE EXAM NITRILE LRG STRL (GLOVE) IMPLANT
GLOVE EXAM NITRILE MD LF STRL (GLOVE) ×2 IMPLANT
GLOVE SKINSENSE NS SZ6.5 (GLOVE)
GLOVE SKINSENSE NS SZ7.0 (GLOVE)
GLOVE SKINSENSE STRL SZ6.5 (GLOVE) IMPLANT
GLOVE SKINSENSE STRL SZ7.0 (GLOVE) IMPLANT
KIT VITRECTOMY (OPHTHALMIC RELATED) IMPLANT
LENS INTRAOCULAR RESTOR ×2 IMPLANT
PAD ARMBOARD 7.5X6 YLW CONV (MISCELLANEOUS) ×2 IMPLANT
PROC W NO LENS (INTRAOCULAR LENS)
PROC W SPEC LENS (INTRAOCULAR LENS) ×3
PROCESS W NO LENS (INTRAOCULAR LENS) IMPLANT
PROCESS W SPEC LENS (INTRAOCULAR LENS) IMPLANT
RETRACTOR IRIS SIGHTPATH (OPHTHALMIC RELATED) IMPLANT
RING MALYGIN (MISCELLANEOUS) IMPLANT
SYRINGE LUER LOK 1CC (MISCELLANEOUS) ×2 IMPLANT
TAPE SURG TRANSPORE 1 IN (GAUZE/BANDAGES/DRESSINGS) IMPLANT
TAPE SURGICAL TRANSPORE 1 IN (GAUZE/BANDAGES/DRESSINGS) ×2
VISCOELASTIC ADDITIONAL (OPHTHALMIC RELATED) IMPLANT
WATER STERILE IRR 250ML POUR (IV SOLUTION) ×2 IMPLANT

## 2014-08-02 NOTE — Op Note (Addendum)
Date of Admission: 08/02/2014  Date of Surgery: 08/02/2014  Pre-Op Dx: Cataract Right  Eye  Post-Op Dx: Senile Nuclear Cataract  Right  Eye,  Dx Code H25.11  Surgeon: Tonny Branch, M.D.  Assistants: None  Anesthesia: Topical with MAC  Indications: Painless, progressive loss of vision with compromise of daily activities.  Surgery: Cataract Extraction with Intraocular lens Implant Right Eye  Discription: The patient had dilating drops and viscous lidocaine placed into the Right eye in the pre-op holding area. After transfer to the operating room, a time out was performed. The patient was then prepped and draped. Beginning with a 5 degree blade a paracentesis port was made at the surgeon's 2 o'clock position. The anterior chamber was then filled with 1% non-preserved lidocaine. This was followed by filling the anterior chamber with Provisc.  A 2.22mm keratome blade was used to make a clear corneal incision at the temporal limbus.  A bent cystatome needle was used to create a continuous tear capsulotomy. Hydrodissection was performed with balanced salt solution on a Fine canula. The lens nucleus was then removed using the phacoemulsification handpiece. Residual cortex was removed with the I&A handpiece. The anterior chamber and capsular bag were refilled with Provisc. A posterior chamber intraocular lens was placed into the capsular bag with it's injector. The implant was positioned with the Kuglan hook. The Provisc was then removed from the anterior chamber and capsular bag with the I&A handpiece. Stromal hydration of the main incision and paracentesis port was performed with BSS on a Fine canula. The wounds were tested for leak which was negative. The patient tolerated the procedure well. There were no operative complications. The patient was then transferred to the recovery room in stable condition.  Complications: None  Specimen: None  EBL: None  Prosthetic device: Alcon AcrySof ReStor, [id number],  power 22.0 D, SN [id number].

## 2014-08-02 NOTE — H&P (Signed)
I have reviewed the H&P, the patient was re-examined, and I have identified no interval changes in medical condition and plan of care since the history and physical of record  

## 2014-08-02 NOTE — Anesthesia Preprocedure Evaluation (Signed)
Anesthesia Evaluation  Patient identified by MRN, date of birth, ID band Patient awake    Reviewed: Allergy & Precautions, NPO status , Patient's Chart, lab work & pertinent test results, reviewed documented beta blocker date and time   Airway Mallampati: II  TM Distance: >3 FB     Dental  (+) Teeth Intact   Pulmonary former smoker,  breath sounds clear to auscultation        Cardiovascular + CAD, + Past MI, + Cardiac Stents and +CHF + dysrhythmias (NSVT) + Cardiac Defibrillator Rhythm:Regular Rate:Bradycardia     Neuro/Psych PSYCHIATRIC DISORDERS Anxiety    GI/Hepatic GERD-  Controlled and Medicated,  Endo/Other    Renal/GU      Musculoskeletal   Abdominal   Peds  Hematology   Anesthesia Other Findings   Reproductive/Obstetrics                             Anesthesia Physical Anesthesia Plan  ASA: III  Anesthesia Plan: MAC   Post-op Pain Management:    Induction: Intravenous  Airway Management Planned: Nasal Cannula  Additional Equipment:   Intra-op Plan:   Post-operative Plan:   Informed Consent: I have reviewed the patients History and Physical, chart, labs and discussed the procedure including the risks, benefits and alternatives for the proposed anesthesia with the patient or authorized representative who has indicated his/her understanding and acceptance.     Plan Discussed with:   Anesthesia Plan Comments:         Anesthesia Quick Evaluation

## 2014-08-02 NOTE — Anesthesia Postprocedure Evaluation (Signed)
  Anesthesia Post-op Note  Patient: Jake Carter  Procedure(s) Performed: Procedure(s) (LRB): CATARACT EXTRACTION PHACO AND INTRAOCULAR LENS PLACEMENT (IOC) (Right)  Patient Location:  Short Stay  Anesthesia Type: MAC  Level of Consciousness: awake  Airway and Oxygen Therapy: Patient Spontanous Breathing  Post-op Pain: none  Post-op Assessment: Post-op Vital signs reviewed, Patient's Cardiovascular Status Stable, Respiratory Function Stable, Patent Airway, No signs of Nausea or vomiting and Pain level controlled  Post-op Vital Signs: Reviewed and stable  Complications: No apparent anesthesia complications

## 2014-08-02 NOTE — Transfer of Care (Signed)
Immediate Anesthesia Transfer of Care Note  Patient: Jake Carter  Procedure(s) Performed: Procedure(s) (LRB): CATARACT EXTRACTION PHACO AND INTRAOCULAR LENS PLACEMENT (IOC) (Right)  Patient Location: Shortstay  Anesthesia Type: MAC  Level of Consciousness: awake  Airway & Oxygen Therapy: Patient Spontanous Breathing   Post-op Assessment: Report given to PACU RN, Post -op Vital signs reviewed and stable and Patient moving all extremities  Post vital signs: Reviewed and stable  Complications: No apparent anesthesia complications

## 2014-08-02 NOTE — Anesthesia Procedure Notes (Signed)
Procedure Name: MAC Date/Time: 08/02/2014 9:04 AM Performed by: Vista Deck Pre-anesthesia Checklist: Patient identified, Emergency Drugs available, Suction available, Timeout performed and Patient being monitored Patient Re-evaluated:Patient Re-evaluated prior to inductionOxygen Delivery Method: Nasal Cannula

## 2014-08-02 NOTE — Discharge Instructions (Signed)

## 2014-08-05 ENCOUNTER — Encounter (HOSPITAL_COMMUNITY): Payer: Self-pay | Admitting: Ophthalmology

## 2014-08-06 ENCOUNTER — Inpatient Hospital Stay (HOSPITAL_COMMUNITY): Admit: 2014-08-06 | Payer: Self-pay

## 2014-08-06 ENCOUNTER — Other Ambulatory Visit (HOSPITAL_BASED_OUTPATIENT_CLINIC_OR_DEPARTMENT_OTHER): Payer: Medicare Other

## 2014-08-06 ENCOUNTER — Other Ambulatory Visit: Payer: Medicare Other

## 2014-08-06 ENCOUNTER — Telehealth: Payer: Self-pay | Admitting: Oncology

## 2014-08-06 ENCOUNTER — Ambulatory Visit (HOSPITAL_BASED_OUTPATIENT_CLINIC_OR_DEPARTMENT_OTHER): Payer: Medicare Other | Admitting: Oncology

## 2014-08-06 ENCOUNTER — Other Ambulatory Visit (HOSPITAL_COMMUNITY)
Admission: RE | Admit: 2014-08-06 | Discharge: 2014-08-06 | Disposition: A | Discharge status: Home / Self Care | Payer: Medicare Other | Source: Ambulatory Visit | Attending: Oncology | Admitting: Oncology

## 2014-08-06 VITALS — BP 121/65 | HR 59 | Temp 97.7°F | Resp 18 | Ht 67.0 in | Wt 163.7 lb

## 2014-08-06 DIAGNOSIS — C182 Malignant neoplasm of ascending colon: Secondary | ICD-10-CM | POA: Diagnosis not present

## 2014-08-06 DIAGNOSIS — R8299 Other abnormal findings in urine: Secondary | ICD-10-CM | POA: Diagnosis not present

## 2014-08-06 DIAGNOSIS — Z85038 Personal history of other malignant neoplasm of large intestine: Secondary | ICD-10-CM | POA: Diagnosis not present

## 2014-08-06 DIAGNOSIS — Z1509 Genetic susceptibility to other malignant neoplasm: Secondary | ICD-10-CM | POA: Diagnosis not present

## 2014-08-06 DIAGNOSIS — R312 Other microscopic hematuria: Secondary | ICD-10-CM | POA: Insufficient documentation

## 2014-08-06 NOTE — Telephone Encounter (Signed)
Gave avs & calendar for April 2017. Sent back to lab

## 2014-08-06 NOTE — Progress Notes (Signed)
  [place] OFFICE PROGRESS NOTE   Diagnosis: Colon cancer, hereditary non-polyposis colon cancer syndrome  INTERVAL HISTORY:   He returns as scheduled. He feels well. He had cataract surgery earlier this week. He reports his insurance will no longer cover yearly endoscopies. He reports intermittent discomfort in the left iliac area.  Objective:  Vital signs in last 24 hours:  Blood pressure 121/65, pulse 59, temperature 97.7 F (36.5 C), temperature source Oral, resp. rate 18, height _0  (1.702 m), weight 163 lb 11.2 oz (74.254 kg), SpO2 100 %.    HEENT: Neck without mass Lymphatics: Pea-sized left posterior cervical node, no other cervical, supra-clavicular, axillary, or inguinal nodes Resp: Lungs clear bilaterally Cardio: Regular rate and rhythm GI: No hepatosplenomegaly, no mass, nontender Vascular: No leg edema Musculoskeletal: Mild tenderness at the left iliac and issue him. No mass.      Lab Results:  Lab Results  Component Value Date   WBC 4.5 07/12/2014   HGB 13.1 07/12/2014   HCT 39.6 07/12/2014   MCV 84.4 07/12/2014   PLT 250 07/12/2014   NEUTROABS 5.2 05/10/2014      Lab Results  Component Value Date   CEA <0.5 08/06/2013    Medications: I have reviewed the patient's current medications.  Assessment/Plan: 1.Stage II colon cancer diagnosed in April 2006. He remains in clinical remission.  2. 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 - he continues surveillance endoscopy and colonoscopy procedures by Dr Cristina Gong. Last colonoscopy was in February 2015 3. Stable small left posterior cervical lymph node.  4. History of coronary artery disease, status post placement of a defibrillator.  5. Status post removal of a parietal scalp lesion in May 2009, with the pathology  confirming a sebaceous neoplasm. 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  6. Discomfort in the left inguinal region -chronic, ? related to interstitial cystitis. He is followed by Dr.Dahlstedt.  7. Gastric ulcers diagnosed in July 2012, biopsy benign, now maintained on Prevacid and followed by Dr.Buccini    Disposition:  He remains in clinical remission from colon cancer. He would like to continue follow-up at the [facility]. He will return for an office visit and CEA in one year. We checked a urine cytology today. I recommended he discuss switching to a 2 year colonoscopy interval with Dr. Cristina Gong. I think this is reasonable given the lack of polyps on the most recent colonoscopy.  Betsy Coder, MD  08/06/2014  12:42 PM

## 2014-08-08 LAB — CEA: CEA: <0.5 ng/mL (ref 0.0–5.0)

## 2014-08-10 ENCOUNTER — Telehealth: Payer: Self-pay | Admitting: *Deleted

## 2014-08-10 NOTE — Telephone Encounter (Signed)
-----   Message from Ladell Pier, MD sent at 08/09/2014  5:13 PM EDT ----- Please call patient, cytology is negative

## 2014-08-10 NOTE — Telephone Encounter (Signed)
Informed pt of negative cytology and normal CEA results. He voiced understanding and appreciation for call.

## 2014-08-14 ENCOUNTER — Other Ambulatory Visit: Payer: Self-pay | Admitting: Family Medicine

## 2014-08-17 ENCOUNTER — Other Ambulatory Visit: Payer: Self-pay | Admitting: Family Medicine

## 2014-08-17 MED ORDER — NITROGLYCERIN 0.4 MG SL SUBL: 0.4000 mg | SUBLINGUAL_TABLET | SUBLINGUAL | Status: AC | PRN

## 2014-08-17 NOTE — Telephone Encounter (Signed)
Rf request for alprazolam.  Last OV was 05/10/14.  Last RX printed 05/10/14.  Please advise rf.

## 2014-08-17 NOTE — Telephone Encounter (Signed)
Pt called and requested new Rx for nitro tabs.  Rx sent into pharmacy.

## 2014-08-18 NOTE — Telephone Encounter (Signed)
Rx faxed to [facility].  

## 2014-08-25 ENCOUNTER — Ambulatory Visit: Payer: Medicare Other | Admitting: Family Medicine

## 2014-09-01 ENCOUNTER — Telehealth: Payer: Self-pay | Admitting: Oncology

## 2014-09-01 NOTE — Telephone Encounter (Signed)
Faxed pt records to [place]

## 2014-09-07 DIAGNOSIS — M25562 Pain in left knee: Secondary | ICD-10-CM | POA: Diagnosis not present

## 2014-09-07 DIAGNOSIS — S8992XA Unspecified injury of left lower leg, initial encounter: Secondary | ICD-10-CM | POA: Diagnosis not present

## 2014-09-15 ENCOUNTER — Ambulatory Visit (INDEPENDENT_AMBULATORY_CARE_PROVIDER_SITE_OTHER): Payer: Medicare Other | Admitting: Family Medicine

## 2014-09-15 ENCOUNTER — Encounter: Payer: Self-pay | Admitting: Family Medicine

## 2014-09-15 VITALS — BP 115/76 | HR 55 | Temp 97.7°F | Resp 16

## 2014-09-15 DIAGNOSIS — R058 Other specified cough: Secondary | ICD-10-CM

## 2014-09-15 DIAGNOSIS — R05 Cough: Secondary | ICD-10-CM

## 2014-09-15 DIAGNOSIS — F418 Other specified anxiety disorders: Secondary | ICD-10-CM | POA: Diagnosis not present

## 2014-09-15 DIAGNOSIS — F329 Major depressive disorder, single episode, unspecified: Secondary | ICD-10-CM

## 2014-09-15 DIAGNOSIS — F419 Anxiety disorder, unspecified: Principal | ICD-10-CM

## 2014-09-15 MED ORDER — FLUTICASONE PROPIONATE 50 MCG/ACT NA SUSP: NASAL | Status: DC

## 2014-09-15 NOTE — Progress Notes (Signed)
Pre visit review using our clinic review tool, if applicable. No additional management support is needed unless otherwise documented below in the visit note. 

## 2014-09-15 NOTE — Progress Notes (Signed)
OFFICE VISIT  10/02/2014   CC:  Chief Complaint  Patient presents with  . Follow-up    Pt is fasting   HPI:     Patient is a 69 y.o. Caucasian male who presents for 4 mo f/u: feeling well overall.  Occ various aches in L hip but 80% improved from last visit. Occ knee pains, no swelling or erythema.  Doing fine from anxiety/depression standpoint on effexor XR 37.5 qd.  C/o PND lately causing tickle and bothersome cough at night x 2 mo.  Zyrtec no help. No nasal sprays.  No ST.  Only having occ substernal burning c/w GER.  Recent update/follow up on his lynch syndrome and history of colon cancer --CEA level normal. Urine cytology normal.  No CP, SOB, or edema present to indicate uncompensated CHF.  Past Medical History  Diagnosis Date  . Cardiomyopathy     EF about 30%  . CAD (coronary artery disease)     apical MI  . 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 ICD (implantable cardiac defibrillator) in place     ICD - Guidant  . Anxiety and depression   . 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 Cristina Gong)  . History of gastric ulcer 10/2010    Bx benign.  . Interstitial cystitis     Dr. Rosana Hoes  . History of diverticulitis of colon   . BPH (benign prostatic hypertrophy) 01/22/2012    Nocturia is his biggest issue   . Benign skin lesion     Chondrodermatitis nodularis (right superior helix) --Dr. Allyson Sabal  . Peyronie's disease     [facility]  . Vitamin D deficiency 2015    secondary to chronic prevacid intake.  2000 U vit D qd.  . Anxiety   . Myocardial infarction 2006    MI x 2  . AICD (automatic cardioverter/defibrillator) present   . CHF (congestive heart failure)   . Lynch syndrome     MSH2 mutation: at higher risk for certain cancers: colon and bladder are the primary ones.    Past Surgical  History  Procedure Laterality Date  . Cardiac defibrillator placement  5/07    Guidant ICD placement  . Cardiac defibrillator placement      [employer] - Remote - yes  . Abdominal surgery  2007    partial colectomy for colon cancer  . Colonoscopy      Normal 01/18/2012 (Dr. Cristina Gong)  . Coronary stent placement  2006  . Tonsillectomy  1972  . Esophagogastroduodenoscopy  05/2013    Normal except mild antral gastritis (this was done for screening purposes due to dx of Lynch syndrome)  . Implantable cardioverter defibrillator (icd) generator change N/A 10/20/2012    Procedure: ICD GENERATOR CHANGE;  Surgeon: Evans Lance, MD;  Location: [facility] CATH LAB;  Service: Cardiovascular;  Laterality: N/A;  . Cataract extraction w/phaco Left 07/19/2014    Procedure: CATARACT EXTRACTION PHACO AND INTRAOCULAR LENS PLACEMENT (IOC);  Surgeon: Tonny Branch, MD;  Location: [facility];  Service: Ophthalmology;  Laterality: Left;  CDE:16.86  . Cataract extraction w/phaco Right 08/02/2014    Procedure: CATARACT EXTRACTION PHACO AND INTRAOCULAR LENS PLACEMENT (IOC);  Surgeon: Tonny Branch, MD;  Location: [facility] ORS;  Service: Ophthalmology;  Laterality: Right;  CDE:10.85    Outpatient Prescriptions Prior to Visit  Medication Sig Dispense Refill  .  ALPRAZolam (XANAX) 0.5 MG tablet TAKE ONE OR TWO TABLETS TWCIE DAILY AS NEEDED FOR ANXIETY 60 tablet 5  . aspirin 81 MG EC tablet Take 81 mg by mouth daily. 1 tabs by mouth daily    . atorvastatin (LIPITOR) 20 MG tablet Take 1 tablet by mouth  daily 90 tablet 1  . Cholecalciferol 2000 UNITS TABS Take 2,000 Units by mouth daily.    . clopidogrel (PLAVIX) 75 MG tablet Take 1 tablet by mouth  daily 90 tablet 1  . Coenzyme Q10 (CO Q 10) 100 MG CAPS Take 2 capsules by mouth daily.     . lansoprazole (PREVACID) 30 MG capsule Take 1 capsule by mouth  daily 90 capsule 1  . lisinopril (PRINIVIL,ZESTRIL) 2.5 MG tablet Take 1 tablet (2.5 mg total) by mouth daily. 30 tablet 11  .  metoprolol succinate (TOPROL-XL) 25 MG 24 hr tablet Take one-half tablet by  mouth daily 45 tablet 1  . Multiple Vitamins-Minerals (CENTRUM SILVER PO) Take 1 tablet by mouth daily.    . nitroGLYCERIN (NITROSTAT) 0.4 MG SL tablet Place 1 tablet (0.4 mg total) under the tongue every 5 (five) minutes as needed for chest pain. 15 tablet 2  . Omega-3 Fatty Acids (FISH OIL) 1200 MG CAPS Take 1 capsule by mouth daily.    Marland Kitchen senna (SENOKOT) 8.6 MG TABS tablet Take 1 tablet by mouth daily.    Marland Kitchen venlafaxine XR (EFFEXOR-XR) 37.5 MG 24 hr capsule Take 1 capsule by mouth  daily 90 capsule 1  . vitamin E 400 UNIT capsule Take 400 Units by mouth daily.    Marland Kitchen RESVERATROL PO Take by mouth.    . TURMERIC PO Take 1 tablet by mouth daily.      No facility-administered medications prior to visit.    Allergies  Allergen Reactions  . Promethazine Hcl Other (See Comments)    Possible hallucinations    ROS As per HPI  PE: Blood pressure 115/76, pulse 55, temperature 97.7 F (36.5 C), temperature source Oral, resp. rate 16, SpO2 95 %. Gen: Alert, well appearing.  Patient is oriented to person, place, time, and situation. ENT: Ears: EACs clear, normal epithelium.  TMs with good light reflex and landmarks bilaterally.  Eyes: no injection, icteris, swelling, or exudate.  EOMI, PERRLA. Nose: no drainage or turbinate edema/swelling.  No injection or focal lesion.  Mouth: lips without lesion/swelling.  Oral mucosa pink and moist.  Dentition intact and without obvious caries or gingival swelling.  Oropharynx without erythema, exudate, or swelling.  CV: RRR, no m/r/g.   LUNGS: CTA bilat, nonlabored resps, good aeration in all lung fields. EXT: no clubbing, cyanosis, or edema.   LABS:    Chemistry      Component Value Date/Time   NA 138 07/12/2014 1050   K 4.8 07/12/2014 1050   CL 102 07/12/2014 1050   CO2 29 07/12/2014 1050   BUN 26* 07/12/2014 1050   CREATININE 1.07 07/12/2014 1050   CREATININE 1.08  06/29/2014 1522      Component Value Date/Time   CALCIUM 9.3 07/12/2014 1050   ALKPHOS 52 05/10/2014 1338   AST 21 05/10/2014 1338   ALT 19 05/10/2014 1338   BILITOT 0.7 05/10/2014 1338       IMPRESSION AND PLAN:  1) Depression and anxiety, chronic--doing well on current med, continue.  2) Upper airway cough syndrome: continue PPI qd for silent GER/LPR.  Increase PPI to bid prn. Add flonase for allergic rhinitis/PND cough.  3) Lynch syndrome: receives appropriate specialist follow up.  4) Ischemic cardiomyopathy: asymptomatic.  Has ICD.  Continue aspirin, statin, ACE-I. He has not been able to tolerate a beta blocker due to low HR and bp.  An After Visit Summary was printed and given to the patient.  FOLLOW UP: Return for f/u as needed.  Pt will be living most of the time in [facility] but will remain an established patient with me.

## 2014-09-20 ENCOUNTER — Ambulatory Visit (INDEPENDENT_AMBULATORY_CARE_PROVIDER_SITE_OTHER): Payer: Medicare Other | Admitting: *Deleted

## 2014-09-20 ENCOUNTER — Telehealth: Payer: Self-pay | Admitting: Cardiology

## 2014-09-20 DIAGNOSIS — I429 Cardiomyopathy, unspecified: Secondary | ICD-10-CM

## 2014-09-20 NOTE — Telephone Encounter (Signed)
Spoke with pt and reminded pt of remote transmission that is due today. Pt verbalized understanding.   

## 2014-09-22 DIAGNOSIS — I429 Cardiomyopathy, unspecified: Secondary | ICD-10-CM

## 2014-09-22 NOTE — Progress Notes (Signed)
Remote ICD transmission.   

## 2014-09-26 LAB — CUP PACEART REMOTE DEVICE CHECK
Battery Remaining Percentage: 100 %
Brady Statistic RV Percent Paced: 0 %
HIGH POWER IMPEDANCE MEASURED VALUE: 52 Ohm
Lead Channel Impedance Value: 714 Ohm
Lead Channel Pacing Threshold Amplitude: 0.9 V
Lead Channel Setting Pacing Pulse Width: 0.4 ms
Lead Channel Setting Sensing Sensitivity: 0.5 mV
MDC IDC MSMT BATTERY REMAINING LONGEVITY: 144 mo
MDC IDC MSMT LEADCHNL RV PACING THRESHOLD PULSEWIDTH: 0.4 ms
MDC IDC PG SERIAL: 123770
MDC IDC SESS DTM: 20160615060300
MDC IDC SET LEADCHNL RV PACING AMPLITUDE: 2.4 V
MDC IDC SET ZONE DETECTION INTERVAL: 250 ms
MDC IDC SET ZONE DETECTION INTERVAL: 353 ms
Zone Setting Detection Interval: 300 ms

## 2014-09-29 ENCOUNTER — Encounter: Payer: Self-pay | Admitting: Cardiology

## 2014-10-01 ENCOUNTER — Encounter: Payer: Self-pay | Admitting: Internal Medicine

## 2014-11-05 ENCOUNTER — Encounter: Payer: Self-pay | Admitting: Family Medicine

## 2014-11-05 ENCOUNTER — Ambulatory Visit (INDEPENDENT_AMBULATORY_CARE_PROVIDER_SITE_OTHER): Payer: Medicare Other | Admitting: Family Medicine

## 2014-11-05 VITALS — BP 88/55 | HR 75 | Temp 97.5°F | Resp 16

## 2014-11-05 DIAGNOSIS — T464X5A Adverse effect of angiotensin-converting-enzyme inhibitors, initial encounter: Principal | ICD-10-CM

## 2014-11-05 DIAGNOSIS — I9589 Other hypotension: Secondary | ICD-10-CM | POA: Diagnosis not present

## 2014-11-05 DIAGNOSIS — R058 Other specified cough: Secondary | ICD-10-CM

## 2014-11-05 DIAGNOSIS — M25562 Pain in left knee: Secondary | ICD-10-CM | POA: Diagnosis not present

## 2014-11-05 DIAGNOSIS — R05 Cough: Secondary | ICD-10-CM | POA: Diagnosis not present

## 2014-11-05 NOTE — Progress Notes (Signed)
OFFICE VISIT  11/07/2014   CC:  Chief Complaint  Patient presents with  . Follow-up    medications     HPI:    Patient is a 69 y.o. Caucasian male who presents for f/u chronic tickle in throat, feels something dripping, notices it mainly with lying down to sleep hs and upon getting up in morning.  Latest nasal spray I tried for him didn't help.  His lisinopril is one of his latest med additions.  BP often under 90 systolic with the addition of this med.    Also still c/o intermittent L knee ache in lateral aspect where his proximal fibula protrudes some.  X-ray in recent past normal. About 2 mo of sx's now but exclusively occurs when he lies down to go to sleep hs.  During the day he can walk, bend, workout-etc without any pain whatsoever.  No knee swelling, erythema, or redness.  He says he just wants me to note it in his chart in case things get worse.    Past Medical History  Diagnosis Date  . Cardiomyopathy     EF about 30%  . CAD (coronary artery disease)     apical MI  . 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 ICD (implantable cardiac defibrillator) in place     ICD - Guidant  . Anxiety and depression   . 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 Cristina Gong)  . History of gastric ulcer 10/2010    Bx benign.  . Interstitial cystitis     Dr. Rosana Hoes  . History of diverticulitis of colon   . BPH (benign prostatic hypertrophy) 01/22/2012    Nocturia is his biggest issue   . Benign skin lesion     Chondrodermatitis nodularis (right superior helix) --Dr. Allyson Sabal  . Peyronie's disease     [facility]  . Vitamin D deficiency 2015    secondary to chronic prevacid intake.  2000 U vit D qd.  . Anxiety   . Myocardial infarction 2006    MI x 2  . AICD (automatic cardioverter/defibrillator) present   . CHF  (congestive heart failure)   . Lynch syndrome     MSH2 mutation: at higher risk for certain cancers: colon and bladder are the primary ones.    Past Surgical History  Procedure Laterality Date  . Cardiac defibrillator placement  5/07    Guidant ICD placement  . Cardiac defibrillator placement      [employer] - Remote - yes  . Abdominal surgery  2007    partial colectomy for colon cancer  . Colonoscopy      Normal 01/18/2012 (Dr. Cristina Gong)  . Coronary stent placement  2006  . Tonsillectomy  1972  . Esophagogastroduodenoscopy  05/2013    Normal except mild antral gastritis (this was done for screening purposes due to dx of Lynch syndrome)  . Implantable cardioverter defibrillator (icd) generator change N/A 10/20/2012    Procedure: ICD GENERATOR CHANGE;  Surgeon: Evans Lance, MD;  Location: [facility] CATH LAB;  Service: Cardiovascular;  Laterality: N/A;  . Cataract extraction w/phaco Left 07/19/2014    Procedure: CATARACT EXTRACTION PHACO AND INTRAOCULAR LENS PLACEMENT (IOC);  Surgeon: Tonny Branch, MD;  Location: [facility];  Service: Ophthalmology;  Laterality: Left;  CDE:16.86  . Cataract extraction w/phaco Right 08/02/2014    Procedure: CATARACT EXTRACTION  PHACO AND INTRAOCULAR LENS PLACEMENT (IOC);  Surgeon: Tonny Branch, MD;  Location: [facility];  Service: Ophthalmology;  Laterality: Right;  CDE:10.85    Outpatient Prescriptions Prior to Visit  Medication Sig Dispense Refill  . ALPRAZolam (XANAX) 0.5 MG tablet TAKE ONE OR TWO TABLETS TWCIE DAILY AS NEEDED FOR ANXIETY 60 tablet 5  . aspirin 81 MG EC tablet Take 81 mg by mouth daily. 1 tabs by mouth daily    . atorvastatin (LIPITOR) 20 MG tablet Take 1 tablet by mouth  daily 90 tablet 1  . Cholecalciferol 2000 UNITS TABS Take 2,000 Units by mouth daily.    . clopidogrel (PLAVIX) 75 MG tablet Take 1 tablet by mouth  daily 90 tablet 1  . Coenzyme Q10 (CO Q 10) 100 MG CAPS Take 2 capsules by mouth daily.     . fluticasone (FLONASE) 50 MCG/ACT  nasal spray 2 sprays in each nostril every morning 48 g 3  . lansoprazole (PREVACID) 30 MG capsule Take 1 capsule by mouth  daily 90 capsule 1  . metoprolol succinate (TOPROL-XL) 25 MG 24 hr tablet Take one-half tablet by  mouth daily 45 tablet 1  . Multiple Vitamins-Minerals (CENTRUM SILVER PO) Take 1 tablet by mouth daily.    . nitroGLYCERIN (NITROSTAT) 0.4 MG SL tablet Place 1 tablet (0.4 mg total) under the tongue every 5 (five) minutes as needed for chest pain. 15 tablet 2  . Omega-3 Fatty Acids (FISH OIL) 1200 MG CAPS Take 1 capsule by mouth daily.    Marland Kitchen RESVERATROL PO Take by mouth.    . senna (SENOKOT) 8.6 MG TABS tablet Take 1 tablet by mouth daily.    . TURMERIC PO Take 1 tablet by mouth daily.     Marland Kitchen venlafaxine XR (EFFEXOR-XR) 37.5 MG 24 hr capsule Take 1 capsule by mouth  daily 90 capsule 1  . vitamin E 400 UNIT capsule Take 400 Units by mouth daily.    Marland Kitchen lisinopril (PRINIVIL,ZESTRIL) 2.5 MG tablet Take 1 tablet (2.5 mg total) by mouth daily. 30 tablet 11   No facility-administered medications prior to visit.    Allergies  Allergen Reactions  . Promethazine Hcl Other (See Comments)    Possible hallucinations    ROS As per HPI  PE: Blood pressure 88/55, pulse 75, temperature 97.5 F (36.4 C), temperature source Oral, resp. rate 16, SpO2 96 %. Gen: Alert, well appearing.  Patient is oriented to person, place, time, and situation. No further exam today.  LABS:  none  IMPRESSION AND PLAN:  1) Tickle-type cough, mostly hs.  Suspect ACE-I cough. D/C lisinopril and give it at least 6-8 weeks to resolve.    2) Hypotension, occasionally with orthostatic sx's: d/c of lisinopril will help this as well. He usually runs low normal bp but I think this med puts him "over the edge".  3) left knee pain, lateral aspect near head of fibula. X -ray normal, exam reassuring in the past.  Reassuring pattern of pain. No further w/u at this time.  Watchful waiting.  An After Visit  Summary was printed and given to the patient.  FOLLOW UP: Return if symptoms worsen or fail to improve.

## 2014-11-05 NOTE — Progress Notes (Signed)
Pre visit review using our clinic review tool, if applicable. No additional management support is needed unless otherwise documented below in the visit note. 

## 2014-12-15 ENCOUNTER — Other Ambulatory Visit: Payer: Self-pay | Admitting: Family Medicine

## 2014-12-15 MED ORDER — VENLAFAXINE HCL ER 37.5 MG PO CP24: 37.5000 mg | ORAL_CAPSULE | Freq: Every day | ORAL | Status: DC

## 2014-12-15 NOTE — Telephone Encounter (Signed)
Patient called requesting 3 day supply of effexor to be sent to pharmacy because he forgot to bring them to town with [place].  Rx sent.

## 2014-12-21 ENCOUNTER — Other Ambulatory Visit: Payer: Self-pay | Admitting: Family Medicine

## 2015-01-11 ENCOUNTER — Encounter: Payer: Self-pay | Admitting: *Deleted

## 2015-01-11 DIAGNOSIS — Z23 Encounter for immunization: Secondary | ICD-10-CM | POA: Diagnosis not present

## 2015-01-12 ENCOUNTER — Telehealth: Payer: Self-pay | Admitting: Family Medicine

## 2015-01-12 MED ORDER — LANSOPRAZOLE 30 MG PO CPDR: DELAYED_RELEASE_CAPSULE | ORAL | Status: DC

## 2015-01-12 MED ORDER — CLOPIDOGREL BISULFATE 75 MG PO TABS: ORAL_TABLET | ORAL | Status: DC

## 2015-01-12 NOTE — Telephone Encounter (Signed)
Patient has not received mail order of Plavix. He is out, please send Rx to [facility], [address], [place], [place]. Please also send Prevacid

## 2015-01-12 NOTE — Telephone Encounter (Signed)
Rx sent to pharmacy   

## 2015-01-24 DIAGNOSIS — Z961 Presence of intraocular lens: Secondary | ICD-10-CM | POA: Diagnosis not present

## 2015-01-24 DIAGNOSIS — H26491 Other secondary cataract, right eye: Secondary | ICD-10-CM | POA: Diagnosis not present

## 2015-01-24 DIAGNOSIS — H04129 Dry eye syndrome of unspecified lacrimal gland: Secondary | ICD-10-CM | POA: Diagnosis not present

## 2015-02-08 ENCOUNTER — Encounter: Payer: Self-pay | Admitting: *Deleted

## 2015-02-16 DIAGNOSIS — Z95810 Presence of automatic (implantable) cardiac defibrillator: Secondary | ICD-10-CM | POA: Diagnosis not present

## 2015-02-16 DIAGNOSIS — Z87891 Personal history of nicotine dependence: Secondary | ICD-10-CM | POA: Diagnosis not present

## 2015-02-16 DIAGNOSIS — E785 Hyperlipidemia, unspecified: Secondary | ICD-10-CM | POA: Diagnosis not present

## 2015-02-16 DIAGNOSIS — I255 Ischemic cardiomyopathy: Secondary | ICD-10-CM | POA: Diagnosis not present

## 2015-02-16 DIAGNOSIS — I2510 Atherosclerotic heart disease of native coronary artery without angina pectoris: Secondary | ICD-10-CM | POA: Diagnosis not present

## 2015-03-07 ENCOUNTER — Encounter: Payer: Self-pay | Admitting: *Deleted

## 2015-03-25 ENCOUNTER — Other Ambulatory Visit: Payer: Self-pay | Admitting: Family Medicine

## 2015-03-25 NOTE — Telephone Encounter (Signed)
LOV: 11/05/14 NOV: None  RF request for clopidogrel Last written: 01/12/15 #30 w/ [id number]  RF request for atorvastatin Last written: 08/16/14 #90 w/ 1RF  RF request for venlafaxine Last written: 12/15/14 #30 w/ 0RF  RF request for lansoprazole Last written: 01/12/15 #30 w/ 0RF

## 2015-04-01 ENCOUNTER — Ambulatory Visit (INDEPENDENT_AMBULATORY_CARE_PROVIDER_SITE_OTHER): Payer: Medicare Other | Admitting: Family Medicine

## 2015-04-01 ENCOUNTER — Encounter: Payer: Self-pay | Admitting: Family Medicine

## 2015-04-01 VITALS — BP 106/71 | HR 62 | Temp 98.1°F | Resp 20

## 2015-04-01 DIAGNOSIS — E559 Vitamin D deficiency, unspecified: Secondary | ICD-10-CM

## 2015-04-01 DIAGNOSIS — R5382 Chronic fatigue, unspecified: Secondary | ICD-10-CM

## 2015-04-01 DIAGNOSIS — Z23 Encounter for immunization: Secondary | ICD-10-CM | POA: Diagnosis not present

## 2015-04-01 LAB — COMPREHENSIVE METABOLIC PANEL
ALK PHOS: 50 U/L (ref 40–115)
ALT: 16 U/L (ref 9–46)
AST: 18 U/L (ref 10–35)
Albumin: 4.3 g/dL (ref 3.6–5.1)
BILIRUBIN TOTAL: 0.5 mg/dL (ref 0.2–1.2)
BUN: 26 mg/dL — AB (ref 7–25)
CALCIUM: 9.3 mg/dL (ref 8.6–10.3)
CO2: 29 mmol/L (ref 20–31)
Chloride: 102 mmol/L (ref 98–110)
Creat: 1.11 mg/dL (ref 0.70–1.25)
Glucose, Bld: 88 mg/dL (ref 65–99)
POTASSIUM: 4.2 mmol/L (ref 3.5–5.3)
Sodium: 138 mmol/L (ref 135–146)
TOTAL PROTEIN: 7 g/dL (ref 6.1–8.1)

## 2015-04-01 LAB — CBC WITH DIFFERENTIAL/PLATELET
BASOS PCT: 1 % (ref 0–1)
Basophils Absolute: 0.1 10*3/uL (ref 0.0–0.1)
EOS ABS: 0.2 10*3/uL (ref 0.0–0.7)
EOS PCT: 3 % (ref 0–5)
HCT: 40.2 % (ref 39.0–52.0)
HEMOGLOBIN: 13.2 g/dL (ref 13.0–17.0)
Lymphocytes Relative: 25 % (ref 12–46)
Lymphs Abs: 1.7 10*3/uL (ref 0.7–4.0)
MCH: 27.3 pg (ref 26.0–34.0)
MCHC: 32.8 g/dL (ref 30.0–36.0)
MCV: 83.1 fL (ref 78.0–100.0)
MONO ABS: 0.5 10*3/uL (ref 0.1–1.0)
MONOS PCT: 8 % (ref 3–12)
MPV: 9.6 fL (ref 8.6–12.4)
NEUTROS ABS: 4.2 10*3/uL (ref 1.7–7.7)
Neutrophils Relative %: 63 % (ref 43–77)
Platelets: 293 10*3/uL (ref 150–400)
RBC: 4.84 MIL/uL (ref 4.22–5.81)
RDW: 14.8 % (ref 11.5–15.5)
WBC: 6.6 10*3/uL (ref 4.0–10.5)

## 2015-04-01 LAB — TSH: TSH: 0.824 u[IU]/mL (ref 0.350–4.500)

## 2015-04-01 MED ORDER — ALPRAZOLAM 0.5 MG PO TABS: ORAL_TABLET | ORAL | Status: DC

## 2015-04-01 NOTE — Progress Notes (Signed)
Office Note 04/03/2015  CC:  Chief Complaint  Patient presents with  . Annual Exam    fatigue   HPI:  Jake Carter is a 69 y.o. White male who is here for f/u: says he is feeling tired.  Plans on taking a few college classes at [employer] soon. Walks about an hour every morning.  He still does CV workouts about 2 times a week for the last 4 mo or so. Drinking avg of a glass of wine per night.  He saw a cardiologist once to get established in [facility].  He is on waiting list for a conceirge MD as a PCP in [facility]. BP monitoring at home always low normal as per his usual. He sees urol for prostate ca screening.  Says he is fatigued lately.   Actually more specifically describes a feeling in afternoons of wanting to take a nap.  When he has something to do he doesn't notice anything wrong, but when there is nothing to occupy him he feels more of this daytime somnolence.  No specific sx's such as SOB, CP, dizziness, pain/achiness, weakness, fevers, or loss of appetite.  No rashes, no joint swelling, no LE swelling, no palpitations or feeling of heart racing.  He does not snore.  No witnessed apneic spells in sleep.  Appetite is good, no abnormal wt loss.   Past Medical History  Diagnosis Date  . Ischemic cardiomyopathy     EF about 30%  . CAD (coronary artery disease)     apical MI  . Ventricular tachycardia, non-sustained (HCC)     with ICD in place  . ICD (implantable cardiac defibrillator) in place     ICD - Guidant  . Anxiety and depression   . 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 Cristina Gong)  . History of gastric ulcer 10/2010    Bx benign.  . Interstitial cystitis     Dr. Rosana Hoes  . History of diverticulitis of colon   . BPH (benign prostatic hypertrophy) 01/22/2012    Nocturia is his biggest issue   . Benign  skin lesion     Chondrodermatitis nodularis (right superior helix) --Dr. Allyson Sabal  . Peyronie's disease     [facility]  . Vitamin D deficiency 2015    secondary to chronic prevacid intake.  2000 U vit D qd.  . Anxiety   . Myocardial infarction ([place]) 2006    MI x 2  . AICD (automatic cardioverter/defibrillator) present   . CHF (congestive heart failure) ([place])   . Lynch syndrome     MSH2 mutation: at higher risk for certain cancers: colon and bladder are the primary ones.    Past Surgical History  Procedure Laterality Date  . Cardiac defibrillator placement  5/07    Guidant ICD placement  . Cardiac defibrillator placement      [employer] - Remote - yes  . Abdominal surgery  2007    partial colectomy for colon cancer  . Colonoscopy      Normal 01/18/2012 (Dr. Cristina Gong)  . Coronary stent placement  2006  . Tonsillectomy  1972  . Esophagogastroduodenoscopy  05/2013    Normal except mild antral gastritis (this was done for screening purposes due to dx of Lynch syndrome)  . Implantable cardioverter defibrillator (icd) generator change N/A 10/20/2012    Procedure: ICD GENERATOR CHANGE;  Surgeon: Champ Mungo  Lovena Le, MD;  Location: [facility] CATH LAB;  Service: Cardiovascular;  Laterality: N/A;  . Cataract extraction w/phaco Left 07/19/2014    Procedure: CATARACT EXTRACTION PHACO AND INTRAOCULAR LENS PLACEMENT (IOC);  Surgeon: Tonny Branch, MD;  Location: [facility] ORS;  Service: Ophthalmology;  Laterality: Left;  CDE:16.86  . Cataract extraction w/phaco Right 08/02/2014    Procedure: CATARACT EXTRACTION PHACO AND INTRAOCULAR LENS PLACEMENT (IOC);  Surgeon: Tonny Branch, MD;  Location: [facility] ORS;  Service: Ophthalmology;  Laterality: Right;  CDE:10.85  . Transthoracic echocardiogram  06/2014    Mild LVH, EF 25-30%, multiple areas of akinesis, grade II DD    Family History  Problem Relation Age of Onset  . Colon cancer Father 42    deceased 6  . Breast cancer Mother 62    deceased 31  . Colon cancer  Brother 65    deceased 7  . Uterine cancer Sister 65    deceased 83  . Uterine cancer Paternal Aunt     deceased 42  . Colon cancer Paternal Uncle 42    deceased 69  . Colon cancer Paternal Grandmother 54    deceased 57  . Colon cancer Paternal Aunt 61    deceased 54  . Kidney cancer Paternal Uncle 75    deceased 101    Social History   Social History  . Marital Status: Married    Spouse Name: N/A  . Number of Children: N/A  . Years of Education: N/A   Occupational History  . Not on file.   Social History Main Topics  . Smoking status: Former Smoker    Quit date: 04/10/1979  . Smokeless tobacco: Never Used  . Alcohol Use: Yes     Comment: wine on weekend  . Drug Use: No  . Sexual Activity: Not on file   Other Topics Concern  . Not on file   Social History Narrative   Married, one son in early 58s.  Lives in [place] as of 2015 but keeps a home in [place].   One year of college education but held high level/leadership position with [employer]   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  Eats healthy diet.             Outpatient Prescriptions Prior to Visit  Medication Sig Dispense Refill  . aspirin 81 MG EC tablet Take 81 mg by mouth daily. 1 tabs by mouth daily    . atorvastatin (LIPITOR) 20 MG tablet Take 1 tablet by mouth  daily 90 tablet 3  . Cholecalciferol 2000 UNITS TABS Take 2,000 Units by mouth daily.    . clopidogrel (PLAVIX) 75 MG tablet Take 1 tablet by mouth  daily 90 tablet 3  . Coenzyme Q10 (CO Q 10) 100 MG CAPS Take 2 capsules by mouth daily.     . fluticasone (FLONASE) 50 MCG/ACT nasal spray 2 sprays in each nostril every morning 48 g 3  . lansoprazole (PREVACID) 30 MG capsule Take 1 capsule by mouth  daily 90 capsule 3  . metoprolol succinate (TOPROL-XL) 25 MG 24 hr tablet Take one-half tablet by  mouth daily 45 tablet 1  . Multiple Vitamins-Minerals (CENTRUM SILVER PO) Take  1 tablet by mouth daily.    . nitroGLYCERIN (NITROSTAT) 0.4 MG SL tablet Place 1 tablet (0.4 mg total) under the tongue every 5 (five) minutes as needed for chest pain. 15 tablet 2  . Omega-3 Fatty Acids (  FISH OIL) 1200 MG CAPS Take 1 capsule by mouth daily.    Marland Kitchen senna (SENOKOT) 8.6 MG TABS tablet Take 1 tablet by mouth daily.    . TURMERIC PO Take 1 tablet by mouth daily.     Marland Kitchen venlafaxine XR (EFFEXOR-XR) 37.5 MG 24 hr capsule Take 1 capsule by mouth  daily 90 capsule 3  . ALPRAZolam (XANAX) 0.5 MG tablet TAKE ONE OR TWO TABLETS TWCIE DAILY AS NEEDED FOR ANXIETY 60 tablet 5  . RESVERATROL PO Take by mouth.    . vitamin E 400 UNIT capsule Take 400 Units by mouth daily.     No facility-administered medications prior to visit.    Allergies  Allergen Reactions  . Promethazine Hcl Other (See Comments)    Possible hallucinations    ROS See HPI PE; Blood pressure 106/71, pulse 62, temperature 98.1 F (36.7 C), temperature source Oral, resp. rate 20, SpO2 97 %. Gen: Alert, well appearing.  Patient is oriented to person, place, time, and situation. AFFECT: pleasant, lucid thought and speech. ENT: Ears: EACs clear, normal epithelium.  TMs with good light reflex and landmarks bilaterally.  Eyes: no injection, icteris, swelling, or exudate.  EOMI, PERRLA. Nose: no drainage or turbinate edema/swelling.  No injection or focal lesion.  Mouth: lips without lesion/swelling.  Oral mucosa pink and moist.  Dentition intact and without obvious caries or gingival swelling.  Oropharynx without erythema, exudate, or swelling.  Neck: supple/nontender.  No LAD, mass, or TM.  Carotid pulses 2+ bilaterally, without bruits. CV: RRR, no m/r/g.   LUNGS: CTA bilat, nonlabored resps, good aeration in all lung fields. ABD: soft, NT, ND, BS normal.  No hepatospenomegaly or mass.  No bruits. EXT: no clubbing, cyanosis, or edema.  Musculoskeletal: no joint swelling, erythema, warmth, or tenderness.  ROM of all joints  intact. Skin - no sores or suspicious lesions or rashes or color changes  Pertinent labs:  Lab Results  Component Value Date   TSH 0.824 04/01/2015   Lab Results  Component Value Date   WBC 6.6 04/01/2015   HGB 13.2 04/01/2015   HCT 40.2 04/01/2015   MCV 83.1 04/01/2015   PLT 293 04/01/2015   Lab Results  Component Value Date   CREATININE 1.11 04/01/2015   BUN 26* 04/01/2015   NA 138 04/01/2015   K 4.2 04/01/2015   CL 102 04/01/2015   CO2 29 04/01/2015   Lab Results  Component Value Date   ALT 16 04/01/2015   AST 18 04/01/2015   ALKPHOS 50 04/01/2015   BILITOT 0.5 04/01/2015   Lab Results  Component Value Date   CHOL 138 07/28/2013   Lab Results  Component Value Date   HDL 72 07/28/2013   Lab Results  Component Value Date   LDLCALC 58 07/28/2013   Lab Results  Component Value Date   TRIG 38 07/28/2013   Lab Results  Component Value Date   CHOLHDL 1.9 07/28/2013   Lab Results  Component Value Date   PSA 3.29 07/28/2013   PSA 3.19 08/07/2011   PSA 2.71 07/24/2010    ASSESSMENT AND PLAN:   1) Daytime somnolence/fatigue: exam normal, no red flag symptoms. We discussed the fact that some of this could actually be his response to boredom since being retired and also recently moving to [place].   Will check CBC, CMET, TSH, ESR.  2) Ischemic cardiomyopathy: stable.  The current medical regimen is effective;  continue present plan and medications.  He has  a cardiologist whom he can see when in [facility], but wants to maintain his cardiologist here as well.  He cannot tolerate ACE-I due to hypotension.  3) Lynch syndrome; hx of colon cancer: he gets appropriate f/u/surveillance with his GI MD here and gets prostate cancer and bladder cancer screening with his urologist, Dr. Eulogio Ditch.  4) Anxiety: The current medical regimen is effective;  continue present plan and medications. RF'd his xanax rx today.  5) Hx of vit D deficiency, mild.  He went  off his vit D for quite a while recently and just restarted it a couple days ago.  We'll see where his vit D level stands at this point.  Pneumovax 23 given today.  An After Visit Summary was printed and given to the patient.  FOLLOW UP:  Return if symptoms worsen or fail to improve.

## 2015-04-02 LAB — SEDIMENTATION RATE: SED RATE: 4 mm/h (ref 0–20)

## 2015-04-02 LAB — VITAMIN D 25 HYDROXY (VIT D DEFICIENCY, FRACTURES): Vit D, 25-Hydroxy: 32 ng/mL (ref 30–100)

## 2015-04-05 ENCOUNTER — Encounter: Payer: Self-pay | Admitting: *Deleted

## 2015-04-15 ENCOUNTER — Other Ambulatory Visit: Payer: Self-pay | Admitting: *Deleted

## 2015-04-15 MED ORDER — ALPRAZOLAM 0.5 MG PO TABS: ORAL_TABLET | ORAL | Status: AC

## 2015-04-15 NOTE — Telephone Encounter (Signed)
OK, rx printed.  Pls remind pt that since this is a controlled substance he needs to make sure he is more responsible with this kind of rx and I can't repeatedly replace lost rx's.-thx  (This has been a long-time patient of mine and he has used this med sparingly in the past.  He has never displayed any drug seeking behavior and I have never had any suspicion that he is abusing or diverting any rx controlled substances).

## 2015-04-15 NOTE — Telephone Encounter (Signed)
Pt called and stated that he lost his Rx for alprazolam that was given to him at his last ov. He is requesting a new Rx be faxed to [facility]. Please advise. Thanks.

## 2015-04-15 NOTE — Telephone Encounter (Signed)
Rx faxed.   Pt advised and voiced understanding.   

## 2015-05-11 ENCOUNTER — Encounter: Payer: Self-pay | Admitting: *Deleted

## 2015-05-25 DIAGNOSIS — F331 Major depressive disorder, recurrent, moderate: Secondary | ICD-10-CM | POA: Diagnosis not present

## 2015-05-25 DIAGNOSIS — F422 Mixed obsessional thoughts and acts: Secondary | ICD-10-CM | POA: Diagnosis not present

## 2015-05-27 DIAGNOSIS — E782 Mixed hyperlipidemia: Secondary | ICD-10-CM | POA: Diagnosis not present

## 2015-06-08 ENCOUNTER — Encounter: Payer: Self-pay | Admitting: *Deleted

## 2015-06-17 DIAGNOSIS — N3010 Interstitial cystitis (chronic) without hematuria: Secondary | ICD-10-CM | POA: Diagnosis not present

## 2015-06-17 DIAGNOSIS — R3121 Asymptomatic microscopic hematuria: Secondary | ICD-10-CM | POA: Diagnosis not present

## 2015-06-17 DIAGNOSIS — Z125 Encounter for screening for malignant neoplasm of prostate: Secondary | ICD-10-CM | POA: Diagnosis not present

## 2015-06-17 DIAGNOSIS — E782 Mixed hyperlipidemia: Secondary | ICD-10-CM | POA: Diagnosis not present

## 2015-06-24 DIAGNOSIS — R311 Benign essential microscopic hematuria: Secondary | ICD-10-CM | POA: Diagnosis not present

## 2015-06-24 DIAGNOSIS — E782 Mixed hyperlipidemia: Secondary | ICD-10-CM | POA: Diagnosis not present

## 2015-06-27 DIAGNOSIS — R9349 Abnormal radiologic findings on diagnostic imaging of other urinary organs: Secondary | ICD-10-CM | POA: Diagnosis not present

## 2015-06-27 DIAGNOSIS — R3121 Asymptomatic microscopic hematuria: Secondary | ICD-10-CM | POA: Diagnosis not present

## 2015-06-27 DIAGNOSIS — R3129 Other microscopic hematuria: Secondary | ICD-10-CM | POA: Diagnosis not present

## 2015-06-28 DIAGNOSIS — Z4502 Encounter for adjustment and management of automatic implantable cardiac defibrillator: Secondary | ICD-10-CM | POA: Diagnosis not present

## 2015-07-05 ENCOUNTER — Other Ambulatory Visit: Payer: Self-pay | Admitting: Gastroenterology

## 2015-07-05 DIAGNOSIS — Z8500 Personal history of malignant neoplasm of unspecified digestive organ: Secondary | ICD-10-CM | POA: Diagnosis not present

## 2015-07-05 DIAGNOSIS — Z85038 Personal history of other malignant neoplasm of large intestine: Secondary | ICD-10-CM | POA: Diagnosis not present

## 2015-07-05 DIAGNOSIS — K317 Polyp of stomach and duodenum: Secondary | ICD-10-CM | POA: Diagnosis not present

## 2015-07-05 DIAGNOSIS — K2980 Duodenitis without bleeding: Secondary | ICD-10-CM | POA: Diagnosis not present

## 2015-07-05 DIAGNOSIS — Z1509 Genetic susceptibility to other malignant neoplasm: Secondary | ICD-10-CM | POA: Diagnosis not present

## 2015-07-20 DIAGNOSIS — N3010 Interstitial cystitis (chronic) without hematuria: Secondary | ICD-10-CM | POA: Diagnosis not present

## 2015-07-20 DIAGNOSIS — R3121 Asymptomatic microscopic hematuria: Secondary | ICD-10-CM | POA: Diagnosis not present

## 2015-08-03 DIAGNOSIS — Z961 Presence of intraocular lens: Secondary | ICD-10-CM | POA: Diagnosis not present

## 2015-08-04 ENCOUNTER — Other Ambulatory Visit: Payer: Self-pay | Admitting: *Deleted

## 2015-08-04 DIAGNOSIS — C182 Malignant neoplasm of ascending colon: Secondary | ICD-10-CM

## 2015-08-05 ENCOUNTER — Telehealth: Payer: Self-pay | Admitting: Oncology

## 2015-08-05 ENCOUNTER — Other Ambulatory Visit (HOSPITAL_BASED_OUTPATIENT_CLINIC_OR_DEPARTMENT_OTHER): Payer: Medicare Other

## 2015-08-05 ENCOUNTER — Encounter: Payer: Self-pay | Admitting: *Deleted

## 2015-08-05 ENCOUNTER — Ambulatory Visit (HOSPITAL_BASED_OUTPATIENT_CLINIC_OR_DEPARTMENT_OTHER): Payer: Medicare Other | Admitting: Oncology

## 2015-08-05 VITALS — BP 115/72 | HR 65 | Temp 98.0°F | Resp 18 | Ht 67.0 in | Wt 162.1 lb

## 2015-08-05 DIAGNOSIS — C182 Malignant neoplasm of ascending colon: Secondary | ICD-10-CM

## 2015-08-05 DIAGNOSIS — Z85038 Personal history of other malignant neoplasm of large intestine: Secondary | ICD-10-CM | POA: Diagnosis not present

## 2015-08-05 NOTE — Progress Notes (Signed)
Message left with patient to f/u with dermatologist regarding sebaceous lesion on scalp per Dr. Benay Spice.

## 2015-08-05 NOTE — Telephone Encounter (Signed)
Gave and printed appt shced and avs for pt for April 2018 °

## 2015-08-05 NOTE — Progress Notes (Signed)
  [place] OFFICE PROGRESS NOTE   Diagnosis: Hereditary non-polyposis colon cancer syndrome, history of colon cancer  INTERVAL HISTORY:   Mr.Crager returns as scheduled. He feels well. He is now living at [place]. He exercises several days per week. He reports undergoing upper/lower endoscopy evaluations by Dr. Cristina Gong within the past 2 months. He underwent a cystoscopy and "CT "by his urologist at the [facility]. Stable nodular lesion at the right parietal scalp. He plans to have a papular lesion at the left shin removed by dermatologist.  Objective:  Vital signs in last 24 hours:  Blood pressure 115/72, pulse 65, temperature 98 F (36.7 C), temperature source Oral, resp. rate 18, height '5\' 7"'$  (1.702 m), weight 162 lb 1.6 oz (73.528 kg), SpO2 100 %.    HEENT:  neck without mass Lymphatics:  pea-sized left posterior cervical node, no other palpable cervical, supra-clavicular, axillary, or inguinal nodes Resp:  lungs clear bilaterally Cardio:  regular rate and rhythm GI:  no hepatomegaly, no mass, nontender Vascular:  no leg edema  skin: 2-3 mm papular lesion at the left chin, firm less than 1 cm nodule at the right parietal scalp   Lab Results:  CEA pending   Imaging:   Medications: I have reviewed the patient's current medications.  Assessment/Plan: 1. Stage II colon cancer diagnosed in April 2006. He remains in clinical remission.  2. 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 - he continues surveillance endoscopy and colonoscopy procedures by Dr Cristina Gong. Last colonoscopy was in 2017 3. Stable small left posterior cervical lymph node.  4. History of coronary artery disease, status post placement of a defibrillator.  5. Status post removal of a parietal scalp lesion in May 2009, with the  pathology confirming a sebaceous neoplasm. 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  6. Discomfort in the left inguinal region -chronic, ? related to interstitial cystitis.  7. Gastric ulcers diagnosed in July 2012, biopsy benign, now maintained on Prevacid and followed by Dr.Buccini      Disposition:   He remains in clinical remission from colon cancer. We will follow-up on the CEA from today. He would like to continue follow-up at the [facility]. Mr. Brill will return for an office visit in one year.  The parietal scalp lesion is likely a benign finding. He will have this evaluated by his dermatologist when the chin lesion is removed.  He continues every 2 year surveillance endoscopies with Dr. Cristina Gong.    Betsy Coder, MD  08/05/2015  12:00 PM

## 2015-08-06 LAB — CEA: CEA1: 1 ng/mL (ref 0.0–4.7)

## 2015-08-06 LAB — CEA (PARALLEL TESTING): CEA: 0.6 ng/mL

## 2015-08-21 ENCOUNTER — Other Ambulatory Visit: Payer: Self-pay | Admitting: Family Medicine

## 2015-08-22 NOTE — Telephone Encounter (Signed)
RF request for metoprolol LOV: 04/01/15 Next ov: None  Last written: 12/21/14 #45 w/ 1RF

## 2015-09-09 DIAGNOSIS — M5402 Panniculitis affecting regions of neck and back, cervical region: Secondary | ICD-10-CM | POA: Diagnosis not present

## 2015-09-09 DIAGNOSIS — E782 Mixed hyperlipidemia: Secondary | ICD-10-CM | POA: Diagnosis not present

## 2015-09-16 DIAGNOSIS — B029 Zoster without complications: Secondary | ICD-10-CM | POA: Diagnosis not present

## 2015-10-17 DIAGNOSIS — I255 Ischemic cardiomyopathy: Secondary | ICD-10-CM | POA: Diagnosis not present

## 2015-10-17 DIAGNOSIS — E782 Mixed hyperlipidemia: Secondary | ICD-10-CM | POA: Diagnosis not present

## 2015-10-17 DIAGNOSIS — I2510 Atherosclerotic heart disease of native coronary artery without angina pectoris: Secondary | ICD-10-CM | POA: Diagnosis not present

## 2015-10-17 DIAGNOSIS — Z95810 Presence of automatic (implantable) cardiac defibrillator: Secondary | ICD-10-CM | POA: Diagnosis not present

## 2015-10-17 DIAGNOSIS — E7800 Pure hypercholesterolemia, unspecified: Secondary | ICD-10-CM | POA: Diagnosis not present

## 2015-10-31 DIAGNOSIS — D2330 Other benign neoplasm of skin of unspecified part of face: Secondary | ICD-10-CM | POA: Diagnosis not present

## 2015-10-31 DIAGNOSIS — L7211 Pilar cyst: Secondary | ICD-10-CM | POA: Diagnosis not present

## 2015-11-02 DIAGNOSIS — D2330 Other benign neoplasm of skin of unspecified part of face: Secondary | ICD-10-CM | POA: Diagnosis not present

## 2015-11-02 DIAGNOSIS — L723 Sebaceous cyst: Secondary | ICD-10-CM | POA: Diagnosis not present

## 2015-11-02 DIAGNOSIS — L739 Follicular disorder, unspecified: Secondary | ICD-10-CM | POA: Diagnosis not present

## 2016-01-18 DIAGNOSIS — Z23 Encounter for immunization: Secondary | ICD-10-CM | POA: Diagnosis not present

## 2016-01-25 DIAGNOSIS — R3121 Asymptomatic microscopic hematuria: Secondary | ICD-10-CM | POA: Diagnosis not present

## 2016-01-25 DIAGNOSIS — N528 Other male erectile dysfunction: Secondary | ICD-10-CM | POA: Diagnosis not present

## 2016-02-16 ENCOUNTER — Other Ambulatory Visit: Payer: Self-pay | Admitting: Family Medicine

## 2016-04-18 DIAGNOSIS — I2510 Atherosclerotic heart disease of native coronary artery without angina pectoris: Secondary | ICD-10-CM | POA: Diagnosis not present

## 2016-04-18 DIAGNOSIS — E7800 Pure hypercholesterolemia, unspecified: Secondary | ICD-10-CM | POA: Diagnosis not present

## 2016-04-18 DIAGNOSIS — Z95810 Presence of automatic (implantable) cardiac defibrillator: Secondary | ICD-10-CM | POA: Diagnosis not present

## 2016-04-18 DIAGNOSIS — I255 Ischemic cardiomyopathy: Secondary | ICD-10-CM | POA: Diagnosis not present

## 2016-05-10 DIAGNOSIS — I2510 Atherosclerotic heart disease of native coronary artery without angina pectoris: Secondary | ICD-10-CM | POA: Diagnosis not present

## 2016-05-10 DIAGNOSIS — E782 Mixed hyperlipidemia: Secondary | ICD-10-CM | POA: Diagnosis not present

## 2016-05-10 DIAGNOSIS — M25562 Pain in left knee: Secondary | ICD-10-CM | POA: Diagnosis not present

## 2016-05-10 DIAGNOSIS — R7309 Other abnormal glucose: Secondary | ICD-10-CM | POA: Diagnosis not present

## 2016-06-19 DIAGNOSIS — Z4502 Encounter for adjustment and management of automatic implantable cardiac defibrillator: Secondary | ICD-10-CM | POA: Diagnosis not present

## 2016-06-28 DIAGNOSIS — R3121 Asymptomatic microscopic hematuria: Secondary | ICD-10-CM | POA: Diagnosis not present

## 2016-06-28 DIAGNOSIS — E782 Mixed hyperlipidemia: Secondary | ICD-10-CM | POA: Diagnosis not present

## 2016-06-28 DIAGNOSIS — Z0000 Encounter for general adult medical examination without abnormal findings: Secondary | ICD-10-CM | POA: Diagnosis not present

## 2016-06-28 DIAGNOSIS — R7309 Other abnormal glucose: Secondary | ICD-10-CM | POA: Diagnosis not present

## 2016-07-03 DIAGNOSIS — Z0000 Encounter for general adult medical examination without abnormal findings: Secondary | ICD-10-CM | POA: Diagnosis not present

## 2016-08-03 ENCOUNTER — Ambulatory Visit: Payer: Medicare Other | Admitting: Oncology

## 2016-09-13 ENCOUNTER — Ambulatory Visit: Payer: Medicare Other | Admitting: Oncology

## 2016-10-01 ENCOUNTER — Ambulatory Visit: Payer: Medicare Other | Admitting: Oncology

## 2016-10-01 ENCOUNTER — Ambulatory Visit (HOSPITAL_BASED_OUTPATIENT_CLINIC_OR_DEPARTMENT_OTHER): Payer: Medicare Other | Admitting: Oncology

## 2016-10-01 ENCOUNTER — Telehealth: Payer: Self-pay | Admitting: Oncology

## 2016-10-01 VITALS — BP 132/71 | HR 59 | Temp 98.0°F | Resp 20 | Ht 67.0 in | Wt 169.3 lb

## 2016-10-01 DIAGNOSIS — Z85038 Personal history of other malignant neoplasm of large intestine: Secondary | ICD-10-CM | POA: Diagnosis not present

## 2016-10-01 DIAGNOSIS — C182 Malignant neoplasm of ascending colon: Secondary | ICD-10-CM

## 2016-10-01 NOTE — Progress Notes (Signed)
  [place] OFFICE PROGRESS NOTE   Diagnosis: Colon cancer, hereditary non-polyposis colon cancer syndrome  INTERVAL HISTORY:   Jake Carter [place] as scheduled. [place] feels well[place] undergoing a screening cystoscopy and urine cytology with his urologist they are. He continues surveillance colonoscopy/endoscopy with Dr. Cristina Gong. He reports the lesion at the parietal scalp was removed and benign.   Objective:  Vital signs in last 24 hours:  Blood pressure 132/71, pulse (!) 59, temperature 98 F (36.7 C), temperature source Oral, resp. rate 20, height _0  (1.702 m), weight 169 lb 4.8 oz (76.8 kg), SpO2 98 %.    HEENT: Neck without mass, no palpable scalp lesion Lymphatics: 2 mm left posterior cervical node, no other palpable cervical, supraclavicular, axillary, or inguinal nodes Resp: Lungs clear bilaterally Cardio: Regular rate and rhythm GI: No hepatosplenomegaly, no mass, nontender Vascular: No leg edema   Medications: I have reviewed the patient's current medications.  Assessment/Plan: 1. Stage II colon cancer diagnosed in April 2006. He remains in clinical remission.  2. 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 - he continues surveillance endoscopy and colonoscopy procedures by Dr Cristina Gong. Last colonoscopy was in 2017 3. Stable small left posterior cervical lymph node.  4. History of coronary artery disease, status post placement of a defibrillator.  5. 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  6. Discomfort in the left inguinal region -chronic, ? related to interstitial cystitis.  7. Gastric ulcers diagnosed in July  2012, biopsy benign, now maintained on Prevacid and followed by Dr.Buccini   Disposition:  He remains in clinical remission from colon cancer. He continues surveillance of the GI tract by Dr. Cristina Gong. He would like to continue follow-up at the [facility]. He will return for an office visit in one year.  15 minutes were spent with the patient today. The majority of the time was used for counseling and coordination of care.  Donneta Romberg, MD  10/01/2016  8:56 AM

## 2016-10-01 NOTE — Addendum Note (Signed)
Addended by: Rosalio Macadamia C on: 10/01/2016 10:20 AM   Modules accepted: Orders

## 2016-10-01 NOTE — Telephone Encounter (Signed)
Appointments scheduled per 10/01/16 los. °Patient was given a copy of the AVS report and appointment schedule, per 10/01/16 los. °

## 2016-10-01 NOTE — Addendum Note (Signed)
Addended by: Rosalio Macadamia C on: 10/01/2016 10:00 AM   Modules accepted: Orders

## 2016-10-03 ENCOUNTER — Encounter: Payer: Self-pay | Admitting: Family Medicine

## 2016-10-17 DIAGNOSIS — Z95810 Presence of automatic (implantable) cardiac defibrillator: Secondary | ICD-10-CM | POA: Diagnosis not present

## 2016-10-17 DIAGNOSIS — I255 Ischemic cardiomyopathy: Secondary | ICD-10-CM | POA: Diagnosis not present

## 2016-10-17 DIAGNOSIS — I2510 Atherosclerotic heart disease of native coronary artery without angina pectoris: Secondary | ICD-10-CM | POA: Diagnosis not present

## 2016-10-17 DIAGNOSIS — E7800 Pure hypercholesterolemia, unspecified: Secondary | ICD-10-CM | POA: Diagnosis not present

## 2016-10-26 DIAGNOSIS — E782 Mixed hyperlipidemia: Secondary | ICD-10-CM | POA: Diagnosis not present

## 2016-11-08 DIAGNOSIS — I361 Nonrheumatic tricuspid (valve) insufficiency: Secondary | ICD-10-CM | POA: Diagnosis not present

## 2016-11-08 DIAGNOSIS — I517 Cardiomegaly: Secondary | ICD-10-CM | POA: Diagnosis not present

## 2016-11-08 DIAGNOSIS — I340 Nonrheumatic mitral (valve) insufficiency: Secondary | ICD-10-CM | POA: Diagnosis not present

## 2017-01-08 DIAGNOSIS — Z961 Presence of intraocular lens: Secondary | ICD-10-CM | POA: Diagnosis not present

## 2017-01-08 DIAGNOSIS — H26492 Other secondary cataract, left eye: Secondary | ICD-10-CM | POA: Diagnosis not present

## 2017-01-08 DIAGNOSIS — H35342 Macular cyst, hole, or pseudohole, left eye: Secondary | ICD-10-CM | POA: Diagnosis not present

## 2017-01-17 DIAGNOSIS — H35342 Macular cyst, hole, or pseudohole, left eye: Secondary | ICD-10-CM | POA: Diagnosis not present

## 2017-01-17 DIAGNOSIS — H43821 Vitreomacular adhesion, right eye: Secondary | ICD-10-CM | POA: Diagnosis not present

## 2017-01-26 ENCOUNTER — Other Ambulatory Visit: Payer: Self-pay | Admitting: Family Medicine

## 2017-01-28 DIAGNOSIS — Z23 Encounter for immunization: Secondary | ICD-10-CM | POA: Diagnosis not present

## 2017-01-28 NOTE — Telephone Encounter (Signed)
Pt is over due for f/u [facility], needs office visit for more refills. Will send Rx's for 90 day supply w/ 0RF.

## 2017-01-28 NOTE — Telephone Encounter (Signed)
SW pt and he stated that he has moved but may be moving back in 6 months. He stated that he will contact his pharmacy and have them send any further refills request to his PCP in [facility].

## 2017-02-12 DIAGNOSIS — H35342 Macular cyst, hole, or pseudohole, left eye: Secondary | ICD-10-CM | POA: Diagnosis not present

## 2017-02-19 DIAGNOSIS — R7309 Other abnormal glucose: Secondary | ICD-10-CM | POA: Diagnosis not present

## 2017-02-19 DIAGNOSIS — D539 Nutritional anemia, unspecified: Secondary | ICD-10-CM | POA: Diagnosis not present

## 2017-02-20 DIAGNOSIS — R7309 Other abnormal glucose: Secondary | ICD-10-CM | POA: Diagnosis not present

## 2017-02-20 DIAGNOSIS — D539 Nutritional anemia, unspecified: Secondary | ICD-10-CM | POA: Diagnosis not present

## 2017-02-20 DIAGNOSIS — E782 Mixed hyperlipidemia: Secondary | ICD-10-CM | POA: Diagnosis not present

## 2017-02-22 DIAGNOSIS — H3581 Retinal edema: Secondary | ICD-10-CM | POA: Diagnosis not present

## 2017-03-11 DIAGNOSIS — H35342 Macular cyst, hole, or pseudohole, left eye: Secondary | ICD-10-CM | POA: Diagnosis not present

## 2017-03-11 DIAGNOSIS — H3581 Retinal edema: Secondary | ICD-10-CM | POA: Diagnosis not present

## 2017-05-13 ENCOUNTER — Other Ambulatory Visit: Payer: Self-pay | Admitting: Family Medicine

## 2017-08-26 ENCOUNTER — Telehealth: Payer: Self-pay | Admitting: Oncology

## 2017-08-26 NOTE — Telephone Encounter (Signed)
Rescheduled appt from 6/24 to 7/1 due to call day. Left voice message.  Sent letter.

## 2017-09-30 ENCOUNTER — Ambulatory Visit: Payer: Medicare Other | Admitting: Oncology

## 2017-10-07 ENCOUNTER — Inpatient Hospital Stay: Payer: Medicare Other | Attending: Oncology | Admitting: Oncology
